# Patient Record
Sex: Male | Born: 2000 | Race: Black or African American | Hispanic: No | Marital: Single | Smoking: Never smoker
Health system: Southern US, Community
[De-identification: ages and names within clinical notes are randomized; demographics above are authoritative.]

## PROBLEM LIST (undated history)

## (undated) ENCOUNTER — Ambulatory Visit (HOSPITAL_COMMUNITY): Admission: EM | Payer: No Typology Code available for payment source

## (undated) DIAGNOSIS — R010 Benign and innocent cardiac murmurs: Secondary | ICD-10-CM

## (undated) DIAGNOSIS — F909 Attention-deficit hyperactivity disorder, unspecified type: Secondary | ICD-10-CM

## (undated) DIAGNOSIS — J45909 Unspecified asthma, uncomplicated: Secondary | ICD-10-CM

## (undated) DIAGNOSIS — F32A Depression, unspecified: Secondary | ICD-10-CM

## (undated) DIAGNOSIS — G43909 Migraine, unspecified, not intractable, without status migrainosus: Secondary | ICD-10-CM

## (undated) DIAGNOSIS — L709 Acne, unspecified: Secondary | ICD-10-CM

## (undated) DIAGNOSIS — T7840XA Allergy, unspecified, initial encounter: Secondary | ICD-10-CM

## (undated) HISTORY — DX: Allergy, unspecified, initial encounter: T78.40XA

## (undated) HISTORY — DX: Attention-deficit hyperactivity disorder, unspecified type: F90.9

## (undated) HISTORY — DX: Benign and innocent cardiac murmurs: R01.0

## (undated) HISTORY — DX: Migraine, unspecified, not intractable, without status migrainosus: G43.909

## (undated) HISTORY — DX: Acne, unspecified: L70.9

---
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
## 2019-01-13 ENCOUNTER — Other Ambulatory Visit: Payer: Self-pay

## 2019-01-13 MED ORDER — AMPHETAMINE-DEXTROAMPHET ER 25 MG PO CP24: 25.0000 mg | ORAL_CAPSULE | ORAL | 0 refills | Status: DC

## 2019-01-13 NOTE — Telephone Encounter (Signed)
Pt mom called and stated patient needs a refill on his Adderall. She stated he has not had it in a few days and he's been having attitude problems.   Pt mom would like a call at [phone] when medication is sent to pharmacy.

## 2019-01-21 IMAGING — DX DG FINGER THUMB 2+V*L*
3 series · 3 of 3 positions shown · non-contrast
Comparison: None

CLINICAL DATA: Jammed thumb today playing basketball, pain
distally, sports injury

EXAM:
LEFT THUMB 2+V

[finger ap]
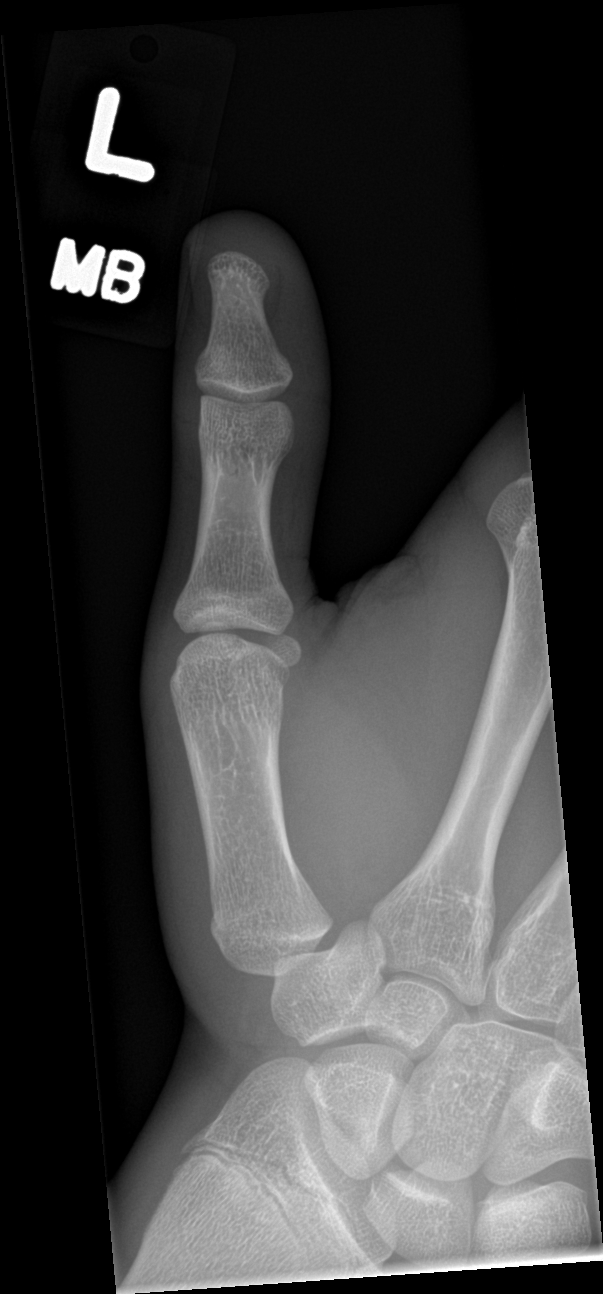

[finger obl]
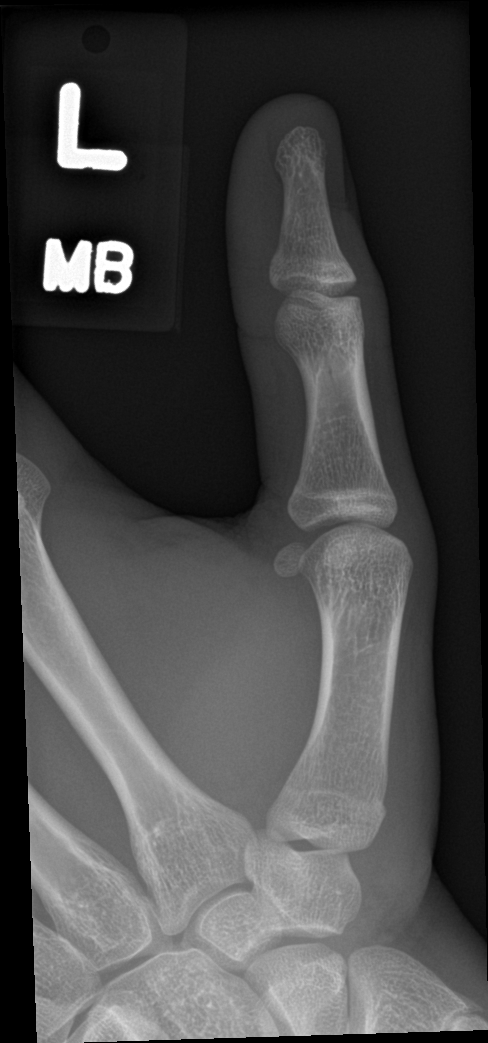

[finger lat]
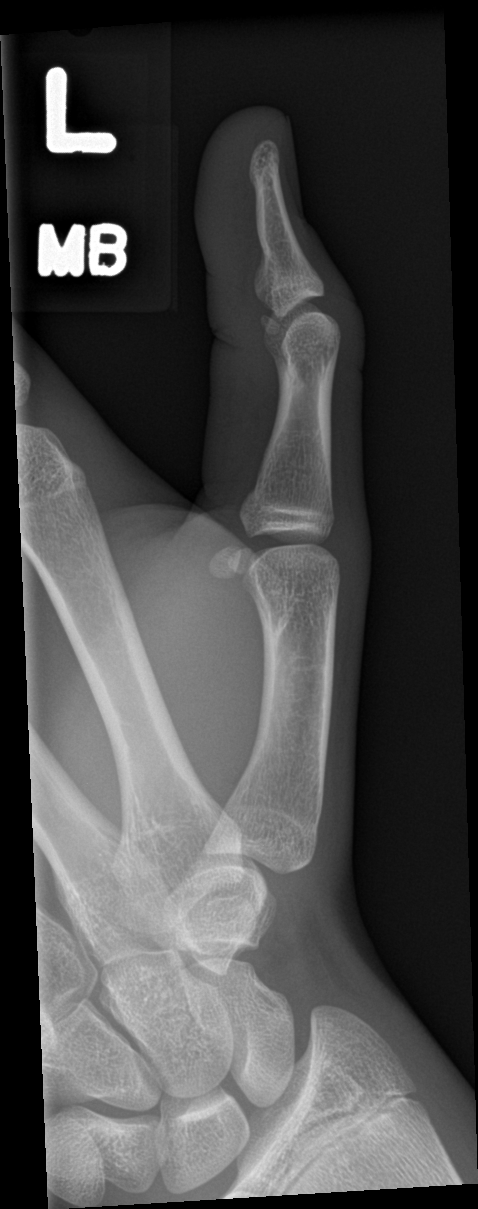

[3 of 3 positions shown; findings below may reference images not displayed]

FINDINGS: Osseous mineralization normal.

Joint spaces preserved.

No acute fracture, dislocation, or bone destruction.
IMPRESSION: No acute osseous abnormalities.

## 2019-04-15 ENCOUNTER — Telehealth: Payer: Self-pay | Admitting: Family Medicine

## 2019-04-15 MED ORDER — AMPHETAMINE-DEXTROAMPHET ER 25 MG PO CP24: 25.0000 mg | ORAL_CAPSULE | ORAL | 0 refills | Status: DC

## 2019-04-15 NOTE — Telephone Encounter (Signed)
Pt mom called and is requesting a refill on pt adderall xr  [id number] [place], [place] , also informed pt mom that pt needs a appt, as it has been a while since pt has been in the office, she states she will call back she has to check pts schedule, as he is in school and works , also informed pt that you was out of the office

## 2019-05-17 ENCOUNTER — Ambulatory Visit (INDEPENDENT_AMBULATORY_CARE_PROVIDER_SITE_OTHER): Payer: Medicaid Other | Admitting: Family Medicine

## 2019-05-17 ENCOUNTER — Encounter: Payer: Self-pay | Admitting: Family Medicine

## 2019-05-17 ENCOUNTER — Other Ambulatory Visit: Payer: Self-pay

## 2019-05-17 VITALS — BP 122/80 | HR 74 | Temp 96.8°F | Wt 165.4 lb

## 2019-05-17 DIAGNOSIS — F902 Attention-deficit hyperactivity disorder, combined type: Secondary | ICD-10-CM

## 2019-05-17 DIAGNOSIS — Z23 Encounter for immunization: Secondary | ICD-10-CM

## 2019-05-17 MED ORDER — AMPHETAMINE-DEXTROAMPHET ER 25 MG PO CP24: 25.0000 mg | ORAL_CAPSULE | ORAL | 0 refills | Status: DC

## 2019-05-17 NOTE — Addendum Note (Signed)
Addended by: Elyse Solan on: 05/17/2019 01:18 PM   Modules accepted: Orders

## 2019-05-17 NOTE — Progress Notes (Signed)
   Subjective:    Patient ID: Ian [name], male    DOB: 06-08-2001, 18 y.o.   MRN: [id number]  HPI He is here for a recheck.  He does have underlying ADHD.  He is not working and plans to go back to [employer].  He does recognize that the Adderall does help him stay focused and therefore stay on task.  He thinks it lasts roughly 8 to 10 hours.  He does not mention any problems when this wears off other than increased distractibility.   Review of Systems     Objective:   Physical Exam Alert and in no distress otherwise not examined      Assessment & Plan:

## 2019-09-28 ENCOUNTER — Telehealth: Payer: Self-pay

## 2019-09-28 MED ORDER — AMPHETAMINE-DEXTROAMPHET ER 25 MG PO CP24: 25.0000 mg | ORAL_CAPSULE | ORAL | 0 refills | Status: DC

## 2019-09-28 NOTE — Telephone Encounter (Signed)
Pt. Mom called stating that he needs a refill on his Adderall 90 day supply [facility] [address] Pt. Last apt. Was 05/17/19.

## 2019-10-24 ENCOUNTER — Ambulatory Visit: Payer: Medicaid Other | Admitting: Family Medicine

## 2019-10-24 ENCOUNTER — Other Ambulatory Visit: Payer: Self-pay

## 2020-07-16 ENCOUNTER — Other Ambulatory Visit: Payer: Self-pay

## 2020-07-16 ENCOUNTER — Emergency Department (HOSPITAL_COMMUNITY): Payer: No Typology Code available for payment source

## 2020-07-16 ENCOUNTER — Emergency Department (HOSPITAL_COMMUNITY)
Admission: EM | Admit: 2020-07-16 | Discharge: 2020-07-16 | Disposition: A | Discharge status: Home / Self Care | Payer: No Typology Code available for payment source | Attending: Emergency Medicine | Admitting: Emergency Medicine

## 2020-07-16 DIAGNOSIS — Z79899 Other long term (current) drug therapy: Secondary | ICD-10-CM | POA: Insufficient documentation

## 2020-07-16 DIAGNOSIS — J4520 Mild intermittent asthma, uncomplicated: Secondary | ICD-10-CM | POA: Insufficient documentation

## 2020-07-16 DIAGNOSIS — S50812A Abrasion of left forearm, initial encounter: Secondary | ICD-10-CM | POA: Insufficient documentation

## 2020-07-16 DIAGNOSIS — Y92410 Unspecified street and highway as the place of occurrence of the external cause: Secondary | ICD-10-CM | POA: Insufficient documentation

## 2020-07-16 DIAGNOSIS — T148XXA Other injury of unspecified body region, initial encounter: Secondary | ICD-10-CM

## 2020-07-16 DIAGNOSIS — S50312A Abrasion of left elbow, initial encounter: Secondary | ICD-10-CM | POA: Insufficient documentation

## 2020-07-16 DIAGNOSIS — S20312A Abrasion of left front wall of thorax, initial encounter: Secondary | ICD-10-CM | POA: Diagnosis not present

## 2020-07-16 DIAGNOSIS — R0782 Intercostal pain: Secondary | ICD-10-CM | POA: Diagnosis present

## 2020-07-16 DIAGNOSIS — R079 Chest pain, unspecified: Secondary | ICD-10-CM | POA: Diagnosis not present
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
## 2020-07-17 ENCOUNTER — Telehealth: Payer: Self-pay

## 2020-07-17 NOTE — Telephone Encounter (Signed)
Transition Care Management Follow-up Telephone Call  Date of discharge and from where: 07/16/2020 [facility]  How have you been since you were released from the hospital? Doing Alright.   Any questions or concerns? No  Items Reviewed:  Did the pt receive and understand the discharge instructions provided? Yes   Medications obtained and verified? No medication given.  Other? No   Any new allergies since your discharge? No   Dietary orders reviewed? Yes  Do you have support at home? Yes   Functional Questionnaire: (I = Independent and D = Dependent) ADLs: I  Bathing/Dressing- I  Meal Prep- I  Eating- I  Maintaining continence- I  Transferring/Ambulation- I  Managing Meds- I  Follow up appointments reviewed:   PCP Hospital f/u appt confirmed? No  Patient does not feel he needs PCP appointment at this time. PCP on file confirmed.   Specialist Hospital f/u appt confirmed? No    Are transportation arrangements needed? No   If their condition worsens, is the pt aware to call PCP or go to the Emergency Dept.? Yes  Was the patient provided with contact information for the PCP's office or ED? Yes  Was to pt encouraged to call back with questions or concerns? Yes

## 2020-07-26 ENCOUNTER — Telehealth: Payer: Medicaid Other | Admitting: Family Medicine

## 2020-07-30 ENCOUNTER — Other Ambulatory Visit: Payer: Self-pay

## 2020-07-30 ENCOUNTER — Ambulatory Visit (INDEPENDENT_AMBULATORY_CARE_PROVIDER_SITE_OTHER): Payer: Medicaid Other | Admitting: Family Medicine

## 2020-07-30 ENCOUNTER — Encounter: Payer: Self-pay | Admitting: Family Medicine

## 2020-07-30 VITALS — BP 128/82 | HR 67 | Temp 96.0°F | Ht 65.0 in | Wt 172.8 lb

## 2020-07-30 DIAGNOSIS — S60022A Contusion of left index finger without damage to nail, initial encounter: Secondary | ICD-10-CM

## 2020-07-30 DIAGNOSIS — Z23 Encounter for immunization: Secondary | ICD-10-CM

## 2020-07-30 NOTE — Progress Notes (Signed)
   Subjective:    Patient ID: Ian [name], male    DOB: 2001-02-25, 19 y.o.   MRN: [id number]  HPI He states that he jammed his left index finger about a week ago playing basketball.  He is having very little difficulty with that. He also would like to get the COVID-vaccine.  Presently he is having no COVID related symptoms.   Review of Systems     Objective:   Physical Exam Exam of the left index finger shows full motion and strength of the finger with minimal tenderness palpation over the MCP.      Assessment & Plan:  Contusion of left index finger without damage to nail, initial encounter  Need for COVID-19 vaccine - Plan: Moderna SARS-CoV-2 Vaccine Recommend buddy taping the index and large finger and no other intervention needed.  He was comfortable with that.

## 2020-08-27 ENCOUNTER — Ambulatory Visit (INDEPENDENT_AMBULATORY_CARE_PROVIDER_SITE_OTHER): Payer: Medicaid Other

## 2020-08-27 DIAGNOSIS — Z23 Encounter for immunization: Secondary | ICD-10-CM | POA: Diagnosis not present

## 2020-12-20 ENCOUNTER — Encounter: Payer: Self-pay | Admitting: Family Medicine

## 2020-12-20 ENCOUNTER — Other Ambulatory Visit: Payer: Self-pay

## 2020-12-20 ENCOUNTER — Ambulatory Visit (INDEPENDENT_AMBULATORY_CARE_PROVIDER_SITE_OTHER): Payer: Medicaid Other | Admitting: Family Medicine

## 2020-12-20 VITALS — BP 128/82 | HR 68 | Temp 97.6°F | Wt 165.4 lb

## 2020-12-20 DIAGNOSIS — F902 Attention-deficit hyperactivity disorder, combined type: Secondary | ICD-10-CM

## 2020-12-20 MED ORDER — AMPHETAMINE-DEXTROAMPHET ER 25 MG PO CP24: 25.0000 mg | ORAL_CAPSULE | ORAL | 0 refills | Status: DC

## 2020-12-20 NOTE — Progress Notes (Signed)
   Subjective:    Patient ID: Ian [name], male    DOB: 2001/01/16, 20 y.o.   MRN: [id number]  HPI  He is here for consult concerning his ADHD.  He has a long history of taking medication for this.  He has tried to avoid it but is now noticing that he is making mistakes at work and he wants to start back on the medication.  Normally it lasts around 8 hours and he has no complaints when he comes off of it.   Review of Systems     Objective:   Physical Exam Alert and in no distress otherwise not examined       Assessment & Plan:  ADHD (attention deficit hyperactivity disorder), combined type - Plan: amphetamine-dextroamphetamine (ADDERALL XR) 25 MG 24 hr capsule, amphetamine-dextroamphetamine (ADDERALL XR) 25 MG 24 hr capsule, amphetamine-dextroamphetamine (ADDERALL XR) 25 MG 24 hr capsule Recommend he take this while at work but explained that he might not necessarily need to take it on a days that he is not working unless he needs to be focused on something.  He was comfortable with that.

## 2021-02-16 ENCOUNTER — Emergency Department (HOSPITAL_COMMUNITY)
Admission: EM | Admit: 2021-02-16 | Discharge: 2021-02-17 | Disposition: A | Discharge status: Home / Self Care | Payer: Worker's Compensation | Attending: Emergency Medicine | Admitting: Emergency Medicine

## 2021-02-16 ENCOUNTER — Other Ambulatory Visit: Payer: Self-pay

## 2021-02-16 ENCOUNTER — Encounter (HOSPITAL_COMMUNITY): Payer: Self-pay | Admitting: Emergency Medicine

## 2021-02-16 DIAGNOSIS — Y93G1 Activity, food preparation and clean up: Secondary | ICD-10-CM | POA: Insufficient documentation

## 2021-02-16 DIAGNOSIS — Y990 Civilian activity done for income or pay: Secondary | ICD-10-CM | POA: Insufficient documentation

## 2021-02-16 DIAGNOSIS — W268XXA Contact with other sharp object(s), not elsewhere classified, initial encounter: Secondary | ICD-10-CM | POA: Insufficient documentation

## 2021-02-16 DIAGNOSIS — Z23 Encounter for immunization: Secondary | ICD-10-CM | POA: Diagnosis not present

## 2021-02-16 DIAGNOSIS — S61211A Laceration without foreign body of left index finger without damage to nail, initial encounter: Secondary | ICD-10-CM | POA: Insufficient documentation

## 2021-02-16 DIAGNOSIS — J45909 Unspecified asthma, uncomplicated: Secondary | ICD-10-CM | POA: Insufficient documentation

## 2021-02-16 DIAGNOSIS — S6992XA Unspecified injury of left wrist, hand and finger(s), initial encounter: Secondary | ICD-10-CM | POA: Diagnosis present

## 2021-02-16 MED ORDER — TETANUS-DIPHTH-ACELL PERTUSSIS 5-2.5-18.5 LF-MCG/0.5 IM SUSY
0.5000 mL | PREFILLED_SYRINGE | Freq: Once | INTRAMUSCULAR | Status: AC
  Administered 2021-02-17: 0.5 mL via INTRAMUSCULAR
  Filled 2021-02-16: qty 0.5

## 2021-02-16 NOTE — ED Provider Notes (Signed)
[facility] EMERGENCY DEPARTMENT Provider Note   CSN: [id number] Arrival date & time: 02/16/21  1723     History Chief Complaint  Patient presents with   Laceration    Ian [name] is a 20 y.o. male presenting for evaluation of finger laceration.  Patient states he was at work when the machine accidentally feet slipped and cut his finger.  He had acute onset pain and bleeding.  He did not take anything for the pain.  He has not done anything to the cut including cleaning it.  He does not know when his last tetanus shot was.  No numbness or tingling.  No injury elsewhere.  Takes Adderall, but no other medicines daily.  HPI     Past Medical History:  Diagnosis Date   Acne    ADHD (attention deficit hyperactivity disorder)    Allergy    Asthma    Functional murmur    Migraines     Patient Active Problem List   Diagnosis Date Noted   Gastroesophageal reflux disease 03/26/2018   Asthma, mild intermittent 03/21/2014   Acne 03/11/2013   Allergic rhinitis due to pollen 03/11/2013   ADHD (attention deficit hyperactivity disorder), combined type 11/03/2011    Past Surgical History:  Procedure Laterality Date   NO PAST SURGERIES  2017       Family History  Problem Relation Age of Onset   Hypertension Mother    Cancer Father        died at 40,unsure type, possibly colon   Hypertension Father    Stroke Maternal Grandmother 63   Heart disease Neg Hx     Social History   Tobacco Use   Smoking status: Never   Smokeless tobacco: Never  Substance Use Topics   Alcohol use: No   Drug use: No    Home Medications Prior to Admission medications   Medication Sig Start Date End Date Taking? Authorizing Provider  albuterol (VENTOLIN HFA) 108 (90 BASE) MCG/ACT inhaler Inhale 2 puffs into the lungs every 6 (six) hours as needed. Patient taking differently: Inhale 2 puffs into the lungs every 6 (six) hours as needed for wheezing or shortness of  breath. 03/21/14   Ronnald Nian, MD  amphetamine-dextroamphetamine (ADDERALL XR) 25 MG 24 hr capsule Take 1 capsule by mouth every morning. 02/19/21   Ronnald Nian, MD  amphetamine-dextroamphetamine (ADDERALL XR) 25 MG 24 hr capsule Take 1 capsule by mouth every morning. 01/19/21   Ronnald Nian, MD  amphetamine-dextroamphetamine (ADDERALL XR) 25 MG 24 hr capsule Take 1 capsule by mouth every morning. 12/20/20   Ronnald Nian, MD  Cyanocobalamin (VITAMIN B12 PO) Take 1 tablet by mouth daily.    [provider]  ibuprofen (ADVIL,MOTRIN) 800 MG tablet Take 1 tablet (800 mg total) by mouth 3 (three) times daily. Patient not taking: No sig reported 01/17/17   Dorena Bodo, NP  Multiple Vitamins-Minerals (ONE-A-DAY MENS HEALTH FORMULA) TABS Take 1 tablet by mouth daily.    [provider]  omeprazole (PRILOSEC) 40 MG capsule TAKE 1 CAPSULE BY MOUTH EVERY DAY Patient not taking: No sig reported 11/01/18   Ronnald Nian, MD    Allergies    Patient has no known allergies.  Review of Systems   Review of Systems  Skin:  Positive for wound.  Hematological:  Does not bruise/bleed easily.   Physical Exam Updated Vital Signs BP 136/79 (BP Location: Right Arm)   Pulse 67  Temp 98.2 F (36.8 C) (Oral)   Resp 16   SpO2 97%   Physical Exam Vitals and nursing note reviewed.  Constitutional:      General: He is not in acute distress.    Appearance: He is well-developed.  HENT:     Head: Normocephalic and atraumatic.  Eyes:     Extraocular Movements: Extraocular movements intact.  Cardiovascular:     Rate and Rhythm: Normal rate.  Pulmonary:     Effort: Pulmonary effort is normal.  Abdominal:     General: There is no distension.  Musculoskeletal:        General: Normal range of motion.     Cervical back: Normal range of motion.     Comments: 1 cm laceration adjacent to the nail of the left index finger on the radial aspect. No active bleeding. Good rom and strength  against resistance of all joints when held in isolation.   Skin:    General: Skin is warm.     Findings: No rash.  Neurological:     Mental Status: He is alert and oriented to person, place, and time.    ED Results / Procedures / Treatments   Labs (all labs ordered are listed, but only abnormal results are displayed) Labs Reviewed - No data to display  EKG None  Radiology No results found.  Procedures Procedures   Medications Ordered in ED Medications  Tdap (BOOSTRIX) injection 0.5 mL (has no administration in time range)    ED Course  I have reviewed the triage vital signs and the nursing notes.  Pertinent labs & imaging results that were available during my care of the patient were reviewed by me and considered in my medical decision making (see chart for details).    MDM Rules/Calculators/A&P                           Patient presenting for evaluation of finger laceration.  On exam, patient appears nontoxic.  He is neurovascular intact.  Laceration is small, not actively bleeding.  No x-ray required.  Patient does not require stitches.  Will clean wound and apply a sterile dressing.  Tetanus updated.  At this time, patient appears safe for discharge.  Return precautions given.  Patient states he understands and agrees to plan.  Final Clinical Impression(s) / ED Diagnoses Final diagnoses:  Laceration of left index finger without foreign body without damage to nail, initial encounter    Rx / DC Orders ED Discharge Orders     None        Kimberlin Scheel, PA-C 02/17/21 0000    Gilda Crease, MD 02/17/21 [phone]

## 2021-02-16 NOTE — ED Triage Notes (Signed)
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  Bandage in place on arrival. Unknown DT.

## 2021-02-16 NOTE — Discharge Instructions (Addendum)
Wash daily with soap and water. Keep covered and clean Use Tylenol or ibuprofen as needed for pain. Return to the emergency room with any new, sudden, concerning symptoms

## 2021-02-17 NOTE — ED Notes (Signed)
E-signature pad unavailable at time of pt discharge. This RN discussed discharge materials with pt and answered all pt questions. Pt stated understanding of discharge material. ? ?

## 2021-02-18 ENCOUNTER — Telehealth: Payer: Self-pay

## 2021-02-18 NOTE — Telephone Encounter (Signed)
Transition Care Management Follow-up Telephone Call Date of discharge and from where: 02/17/2021-[facility] ED  How have you been since you were released from the hospital? Patient stated he is doing fine.  Any questions or concerns? No  Items Reviewed: Did the pt receive and understand the discharge instructions provided? Yes  Medications obtained and verified? Yes  Other? No  Any new allergies since your discharge? No  Dietary orders reviewed? N/A Do you have support at home? Yes   Home Care and Equipment/Supplies: Were home health services ordered? not applicable If so, what is the name of the agency? N/A  Has the agency set up a time to come to the patient's home? not applicable Were any new equipment or medical supplies ordered?  No What is the name of the medical supply agency? N/A Were you able to get the supplies/equipment? not applicable Do you have any questions related to the use of the equipment or supplies? No  Functional Questionnaire: (I = Independent and D = Dependent) ADLs: I  Bathing/Dressing- I  Meal Prep- I  Eating- I  Maintaining continence- I  Transferring/Ambulation- I  Managing Meds- I  Follow up appointments reviewed:  PCP Hospital f/u appt confirmed? No  . Specialist Hospital f/u appt confirmed? No   Are transportation arrangements needed? No  If their condition worsens, is the pt aware to call PCP or go to the Emergency Dept.? Yes Was the patient provided with contact information for the PCP's office or ED? Yes Was to pt encouraged to call back with questions or concerns? Yes

## 2021-04-05 ENCOUNTER — Telehealth: Payer: Self-pay | Admitting: Family Medicine

## 2021-04-05 DIAGNOSIS — F902 Attention-deficit hyperactivity disorder, combined type: Secondary | ICD-10-CM

## 2021-04-05 MED ORDER — AMPHETAMINE-DEXTROAMPHET ER 25 MG PO CP24: 25.0000 mg | ORAL_CAPSULE | ORAL | 0 refills | Status: DC

## 2021-04-05 NOTE — Telephone Encounter (Signed)
Needs refill on adderall   [facility] [address]

## 2021-04-26 DIAGNOSIS — H5213 Myopia, bilateral: Secondary | ICD-10-CM | POA: Diagnosis not present

## 2021-06-18 ENCOUNTER — Telehealth: Payer: Self-pay | Admitting: Family Medicine

## 2021-06-18 NOTE — Telephone Encounter (Signed)
Called pt no answer and VM not set up. Will call 06/19/21

## 2021-06-18 NOTE — Telephone Encounter (Signed)
Pt needs [facility]ill [facility] [address] 

## 2021-06-20 NOTE — Telephone Encounter (Signed)
Mom advised Rx at pharmacy, she said she went up there and they said they didn't have Rx.  I called pharmacy and they had refill and will fill.  Mom informed

## 2022-04-30 ENCOUNTER — Telehealth: Payer: Self-pay

## 2022-04-30 ENCOUNTER — Encounter: Payer: Self-pay | Admitting: Family Medicine

## 2022-04-30 NOTE — Telephone Encounter (Signed)
Pt. Called stating he was getting a physical done for his new job and needed a letter typed up by his PCP stating he is ok to drive while on a controlled substance. He takes Adderall XR 25mg .

## 2022-05-05 ENCOUNTER — Telehealth: Payer: Self-pay | Admitting: Family Medicine

## 2022-05-05 ENCOUNTER — Telehealth: Payer: Self-pay

## 2022-05-05 ENCOUNTER — Encounter: Payer: Self-pay | Admitting: Family Medicine

## 2022-05-05 NOTE — Telephone Encounter (Signed)
Pt called he needs note for work showing when and how he is supposed to be taking  Adderall   Please call when rady

## 2022-05-05 NOTE — Telephone Encounter (Signed)
Letter typed, & pt informed ready for pick up

## 2022-05-05 NOTE — Telephone Encounter (Signed)
Pt. Called back stating that at the [facility] on [address] on [address] needed a PNP awareness print out. 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

## 2022-05-06 ENCOUNTER — Other Ambulatory Visit: Payer: Self-pay | Admitting: Medical

## 2022-05-06 NOTE — Telephone Encounter (Signed)
Dr. Redmond School its the PDMP report he needs, if you will please print it out. Thanks
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
## 2022-07-22 ENCOUNTER — Telehealth (INDEPENDENT_AMBULATORY_CARE_PROVIDER_SITE_OTHER): Payer: Medicaid Other | Admitting: Family Medicine

## 2022-07-22 DIAGNOSIS — F902 Attention-deficit hyperactivity disorder, combined type: Secondary | ICD-10-CM | POA: Diagnosis not present

## 2022-07-22 NOTE — Progress Notes (Signed)
   Subjective:    Patient ID: Ian [name], male    DOB: 06-07-2001, 22 y.o.   MRN: [id number]  HPI Documentation for virtual audio and video telecommunications through [place] encounter: The patient was located at home. 2 patient identifiers used.  The provider was located in the office. The patient did consent to this visit and is aware of possible charges through their insurance for this visit. The other persons participating in this telemedicine service were none. Time spent on call was 5 minutes and in review of previous records >20 minutes total for counseling and coordination of care. This virtual service is not related to other E/M service within previous 7 days.  He has a history of ADHD and presently is taking Adderall XR 25.  His main concern today is how often does he need to take the medication.  I further questioned him on how long it lasted.  He could not give me a good answer as he does take it around 3 in the afternoon to help him with his work but apparently does not interfere with his sleep.  Apparently does help with focus although is difficult to get him to fully assess this.  Review of Systems     Objective:   Physical Exam Alert and in no distress otherwise not examined       Assessment & Plan:  ADHD (attention deficit hyperactivity disorder), combined type Discussed the fact that he can take this medication on an as-needed basis specifically stating if he is not working and does not need to stay focused is okay to not take it.  If he notes that his focus is not what it should be, he should let me know so we can potentially readjust the medication.

## 2022-08-13 ENCOUNTER — Telehealth: Payer: Self-pay | Admitting: Family Medicine

## 2022-08-13 DIAGNOSIS — F902 Attention-deficit hyperactivity disorder, combined type: Secondary | ICD-10-CM

## 2022-08-13 MED ORDER — AMPHETAMINE-DEXTROAMPHET ER 25 MG PO CP24: 25.0000 mg | ORAL_CAPSULE | ORAL | 0 refills | Status: DC

## 2022-08-13 NOTE — Telephone Encounter (Signed)
[facility]

## 2022-09-03 ENCOUNTER — Emergency Department (HOSPITAL_BASED_OUTPATIENT_CLINIC_OR_DEPARTMENT_OTHER)
Admission: EM | Admit: 2022-09-03 | Discharge: 2022-09-03 | Disposition: A | Discharge status: Home / Self Care | Payer: Medicaid Other | Attending: Emergency Medicine | Admitting: Emergency Medicine

## 2022-09-03 ENCOUNTER — Emergency Department (HOSPITAL_BASED_OUTPATIENT_CLINIC_OR_DEPARTMENT_OTHER): Payer: Medicaid Other

## 2022-09-03 ENCOUNTER — Encounter (HOSPITAL_BASED_OUTPATIENT_CLINIC_OR_DEPARTMENT_OTHER): Payer: Self-pay

## 2022-09-03 ENCOUNTER — Other Ambulatory Visit: Payer: Self-pay

## 2022-09-03 DIAGNOSIS — S0992XA Unspecified injury of nose, initial encounter: Secondary | ICD-10-CM

## 2022-09-03 DIAGNOSIS — S0033XA Contusion of nose, initial encounter: Secondary | ICD-10-CM | POA: Insufficient documentation

## 2022-09-03 DIAGNOSIS — Z041 Encounter for examination and observation following transport accident: Secondary | ICD-10-CM | POA: Diagnosis not present

## 2022-09-03 DIAGNOSIS — J3489 Other specified disorders of nose and nasal sinuses: Secondary | ICD-10-CM | POA: Diagnosis present

## 2022-09-03 DIAGNOSIS — S0993XA Unspecified injury of face, initial encounter: Secondary | ICD-10-CM | POA: Diagnosis not present

## 2022-09-03 DIAGNOSIS — I10 Essential (primary) hypertension: Secondary | ICD-10-CM | POA: Diagnosis not present

## 2022-09-03 DIAGNOSIS — Y92410 Unspecified street and highway as the place of occurrence of the external cause: Secondary | ICD-10-CM | POA: Diagnosis not present

## 2022-09-03 MED ORDER — CYCLOBENZAPRINE HCL 10 MG PO TABS: 10.0000 mg | ORAL_TABLET | Freq: Two times a day (BID) | ORAL | 0 refills | Status: DC | PRN

## 2022-09-03 MED ORDER — IBUPROFEN 800 MG PO TABS: 800.0000 mg | ORAL_TABLET | Freq: Once | ORAL | Status: DC
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
## 2022-09-08 ENCOUNTER — Ambulatory Visit: Payer: Medicaid Other | Admitting: Medical

## 2022-09-11 ENCOUNTER — Ambulatory Visit: Payer: Medicaid Other | Admitting: Medical

## 2022-09-15 ENCOUNTER — Ambulatory Visit: Payer: Medicaid Other | Admitting: Medical

## 2022-09-15 ENCOUNTER — Other Ambulatory Visit: Payer: Self-pay | Admitting: *Deleted

## 2022-09-15 ENCOUNTER — Encounter: Payer: Self-pay | Admitting: Medical

## 2022-09-15 ENCOUNTER — Encounter: Payer: Self-pay | Admitting: Family Medicine

## 2022-09-15 DIAGNOSIS — M79644 Pain in right finger(s): Secondary | ICD-10-CM | POA: Diagnosis not present

## 2022-09-15 DIAGNOSIS — M545 Low back pain, unspecified: Secondary | ICD-10-CM

## 2022-09-15 DIAGNOSIS — S060X9D Concussion with loss of consciousness of unspecified duration, subsequent encounter: Secondary | ICD-10-CM | POA: Diagnosis not present

## 2022-09-15 DIAGNOSIS — R519 Headache, unspecified: Secondary | ICD-10-CM | POA: Diagnosis not present
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
## 2022-09-15 NOTE — Progress Notes (Signed)
done 
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
## 2022-09-22 ENCOUNTER — Telehealth: Payer: Self-pay | Admitting: Family Medicine

## 2022-09-22 DIAGNOSIS — F902 Attention-deficit hyperactivity disorder, combined type: Secondary | ICD-10-CM

## 2022-09-22 MED ORDER — AMPHETAMINE-DEXTROAMPHET ER 25 MG PO CP24: 25.0000 mg | ORAL_CAPSULE | ORAL | 0 refills | Status: DC

## 2022-09-22 NOTE — Telephone Encounter (Signed)
Pt called and is requesting a refill for his adderall [id number] [place]

## 2022-10-06 ENCOUNTER — Telehealth: Payer: Self-pay | Admitting: Family Medicine

## 2022-10-06 NOTE — Telephone Encounter (Signed)
Pt called and is wanting to know if you can refer him out to a psychiatrist. Informed him that you do not work in the office on Mondays

## 2022-10-07 NOTE — Telephone Encounter (Signed)
Patient advised. Appointment scheduled.  

## 2022-10-09 ENCOUNTER — Encounter: Payer: Self-pay | Admitting: Family Medicine

## 2022-10-09 ENCOUNTER — Telehealth (INDEPENDENT_AMBULATORY_CARE_PROVIDER_SITE_OTHER): Payer: Medicaid Other | Admitting: Family Medicine

## 2022-10-09 VITALS — Wt 170.0 lb

## 2022-10-09 DIAGNOSIS — F439 Reaction to severe stress, unspecified: Secondary | ICD-10-CM

## 2022-10-09 NOTE — Progress Notes (Signed)
   Subjective:    Patient ID: Ian [name], male    DOB: Jul 07, 2001, 22 y.o.   MRN: [id number]  HPI He is here for consult concerning needing counseling.  He is now realizing that he has had a lot of things that he has bottled up inside of him wants to talk about it with someone to help resolve all these issues.  He does not state anything in particular but his general complaints.  He does not have any suicidal ideation.   Review of Systems     Objective:   Physical Exam Alert and in no distress otherwise not examined       Assessment & Plan:  Stress I congratulated him on when the take better care of himself psychologically.  I will refer him to [facility].

## 2022-10-27 ENCOUNTER — Telehealth: Payer: Self-pay | Admitting: Family Medicine

## 2022-10-27 DIAGNOSIS — F902 Attention-deficit hyperactivity disorder, combined type: Secondary | ICD-10-CM

## 2022-10-27 MED ORDER — AMPHETAMINE-DEXTROAMPHET ER 25 MG PO CP24: 25.0000 mg | ORAL_CAPSULE | ORAL | 0 refills | Status: DC

## 2022-10-27 NOTE — Telephone Encounter (Signed)
Pt called and is requesting a refill on his adderall [id number] , [place] - [address] 

## 2022-11-12 ENCOUNTER — Telehealth: Payer: Self-pay | Admitting: Family Medicine

## 2022-11-12 NOTE — Telephone Encounter (Signed)
Pt needs letter for school stating that he is taking adderall XR  and how he is taking  Please call

## 2022-11-24 DIAGNOSIS — F819 Developmental disorder of scholastic skills, unspecified: Secondary | ICD-10-CM | POA: Diagnosis not present

## 2022-11-24 DIAGNOSIS — F411 Generalized anxiety disorder: Secondary | ICD-10-CM | POA: Diagnosis not present

## 2022-11-24 DIAGNOSIS — F4381 Prolonged grief disorder: Secondary | ICD-10-CM | POA: Diagnosis not present

## 2022-12-02 ENCOUNTER — Telehealth: Payer: Self-pay | Admitting: Family Medicine

## 2022-12-02 DIAGNOSIS — F902 Attention-deficit hyperactivity disorder, combined type: Secondary | ICD-10-CM

## 2022-12-02 MED ORDER — AMPHETAMINE-DEXTROAMPHET ER 25 MG PO CP24: 25.0000 mg | ORAL_CAPSULE | ORAL | 0 refills | Status: DC

## 2022-12-02 NOTE — Telephone Encounter (Signed)
Pt mom called and is requesting pt adderall [facility] to [id number] - [place], [place] - [address]

## 2022-12-25 ENCOUNTER — Telehealth: Payer: Self-pay | Admitting: Family Medicine

## 2022-12-25 DIAGNOSIS — F902 Attention-deficit hyperactivity disorder, combined type: Secondary | ICD-10-CM

## 2022-12-25 MED ORDER — AMPHETAMINE-DEXTROAMPHET ER 25 MG PO CP24: 25.0000 mg | ORAL_CAPSULE | ORAL | 0 refills | Status: DC

## 2022-12-25 NOTE — Telephone Encounter (Signed)
Pt needs a [id number] [place], [place] - [address]

## 2023-01-29 ENCOUNTER — Telehealth: Payer: Self-pay | Admitting: Family Medicine

## 2023-01-29 DIAGNOSIS — F902 Attention-deficit hyperactivity disorder, combined type: Secondary | ICD-10-CM

## 2023-01-29 MED ORDER — AMPHETAMINE-DEXTROAMPHET ER 25 MG PO CP24: 25.0000 mg | ORAL_CAPSULE | ORAL | 0 refills | Status: DC

## 2023-01-29 NOTE — Telephone Encounter (Signed)
Pt called requesting refill on Adderall XR 25   [facility] [address]

## 2023-02-08 ENCOUNTER — Encounter (HOSPITAL_COMMUNITY): Payer: Self-pay

## 2023-02-08 ENCOUNTER — Ambulatory Visit (HOSPITAL_COMMUNITY)
Admission: EM | Admit: 2023-02-08 | Discharge: 2023-02-08 | Disposition: A | Discharge status: Home / Self Care | Payer: Medicaid Other

## 2023-02-08 DIAGNOSIS — S61412A Laceration without foreign body of left hand, initial encounter: Secondary | ICD-10-CM | POA: Diagnosis not present

## 2023-02-08 NOTE — Discharge Instructions (Addendum)
Your laceration is very superficial.  It does not require any sutures or glue to repair it.  Cleanse the wound at least twice daily.  You may apply Aquaphor or another type of ointment such as Vaseline to the wound twice daily to promote wound healing.  Watch out for signs of infection such as redness, swelling, pus coming out of the site, or worsening pain.  If you develop any new or worsening symptoms or if your symptoms do not start to improve, pleases return here or follow-up with your primary care provider. If your symptoms are severe, please go to the emergency room.
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
## 2023-02-09 ENCOUNTER — Ambulatory Visit (INDEPENDENT_AMBULATORY_CARE_PROVIDER_SITE_OTHER): Payer: Medicaid Other | Admitting: Family Medicine

## 2023-02-09 ENCOUNTER — Encounter: Payer: Self-pay | Admitting: Family Medicine

## 2023-02-09 VITALS — BP 130/68 | HR 67 | Temp 97.9°F | Resp 16 | Ht 66.5 in | Wt 167.0 lb

## 2023-02-09 DIAGNOSIS — F902 Attention-deficit hyperactivity disorder, combined type: Secondary | ICD-10-CM | POA: Diagnosis not present

## 2023-02-09 DIAGNOSIS — Z0000 Encounter for general adult medical examination without abnormal findings: Secondary | ICD-10-CM | POA: Diagnosis not present

## 2023-02-09 DIAGNOSIS — J301 Allergic rhinitis due to pollen: Secondary | ICD-10-CM

## 2023-02-09 DIAGNOSIS — L709 Acne, unspecified: Secondary | ICD-10-CM

## 2023-02-09 DIAGNOSIS — J4520 Mild intermittent asthma, uncomplicated: Secondary | ICD-10-CM

## 2023-02-09 DIAGNOSIS — K219 Gastro-esophageal reflux disease without esophagitis: Secondary | ICD-10-CM

## 2023-02-09 DIAGNOSIS — F320 Major depressive disorder, single episode, mild: Secondary | ICD-10-CM | POA: Diagnosis not present
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
## 2023-02-09 NOTE — Patient Instructions (Signed)
Call me after the visit to the therapist and let me know how it went.  Have the therapist send me a note

## 2023-02-10 LAB — COMPREHENSIVE METABOLIC PANEL
ALT: 15 IU/L (ref 0–44)
AST: 19 IU/L (ref 0–40)
Albumin: 4.7 g/dL (ref 4.3–5.2)
Alkaline Phosphatase: 74 IU/L (ref 44–121)
BUN/Creatinine Ratio: 12 (ref 9–20)
BUN: 11 mg/dL (ref 6–20)
Bilirubin Total: 0.3 mg/dL (ref 0.0–1.2)
CO2: 25 mmol/L (ref 20–29)
Calcium: 9.7 mg/dL (ref 8.7–10.2)
Chloride: 104 mmol/L (ref 96–106)
Creatinine, Ser: 0.89 mg/dL (ref 0.76–1.27)
Globulin, Total: 2.3 g/dL (ref 1.5–4.5)
Glucose: 84 mg/dL (ref 70–99)
Potassium: 5 mmol/L (ref 3.5–5.2)
Sodium: 141 mmol/L (ref 134–144)
Total Protein: 7 g/dL (ref 6.0–8.5)
eGFR: 124 mL/min/{1.73_m2} (ref >59)

## 2023-02-10 LAB — CBC WITH DIFFERENTIAL/PLATELET
Basophils Absolute: 0.1 10*3/uL (ref 0.0–0.2)
Basos: 2 %
EOS (ABSOLUTE): 0.8 10*3/uL — ABNORMAL HIGH (ref 0.0–0.4)
Eos: 18 %
Hematocrit: 48.1 % (ref 37.5–51.0)
Hemoglobin: 15.3 g/dL (ref 13.0–17.7)
Immature Grans (Abs): 0 10*3/uL (ref 0.0–0.1)
Immature Granulocytes: 0 %
Lymphocytes Absolute: 1.6 10*3/uL (ref 0.7–3.1)
Lymphs: 38 %
MCH: 27.8 pg (ref 26.6–33.0)
MCHC: 31.8 g/dL (ref 31.5–35.7)
MCV: 88 fL (ref 79–97)
Monocytes Absolute: 0.3 10*3/uL (ref 0.1–0.9)
Monocytes: 7 %
Neutrophils Absolute: 1.5 10*3/uL (ref 1.4–7.0)
Neutrophils: 35 %
Platelets: 248 10*3/uL (ref 150–450)
RBC: 5.5 x10E6/uL (ref 4.14–5.80)
RDW: 12.6 % (ref 11.6–15.4)
WBC: 4.3 10*3/uL (ref 3.4–10.8)

## 2023-02-10 LAB — LIPID PANEL
Chol/HDL Ratio: 2.5 ratio (ref 0.0–5.0)
Cholesterol, Total: 133 mg/dL (ref 100–199)
HDL: 53 mg/dL (ref >39)
LDL Chol Calc (NIH): 71 mg/dL (ref 0–99)
Triglycerides: 37 mg/dL (ref 0–149)
VLDL Cholesterol Cal: 9 mg/dL (ref 5–40)

## 2023-03-02 DIAGNOSIS — F819 Developmental disorder of scholastic skills, unspecified: Secondary | ICD-10-CM | POA: Diagnosis not present

## 2023-03-02 DIAGNOSIS — F411 Generalized anxiety disorder: Secondary | ICD-10-CM | POA: Diagnosis not present

## 2023-03-02 DIAGNOSIS — F4381 Prolonged grief disorder: Secondary | ICD-10-CM | POA: Diagnosis not present

## 2023-03-03 ENCOUNTER — Telehealth: Payer: Self-pay | Admitting: Family Medicine

## 2023-03-03 NOTE — Telephone Encounter (Signed)
Needs refill on adderall xr   Patient would also like you to call him, he wants to talk to you about his counseling appointment he had yesterday

## 2023-03-14 ENCOUNTER — Encounter (HOSPITAL_COMMUNITY): Payer: Self-pay

## 2023-03-14 ENCOUNTER — Ambulatory Visit (HOSPITAL_COMMUNITY)
Admission: EM | Admit: 2023-03-14 | Discharge: 2023-03-14 | Disposition: A | Discharge status: Home / Self Care | Payer: Medicaid Other | Source: Home / Self Care | Attending: Family Medicine | Admitting: Family Medicine

## 2023-03-14 ENCOUNTER — Ambulatory Visit (INDEPENDENT_AMBULATORY_CARE_PROVIDER_SITE_OTHER): Payer: Medicaid Other

## 2023-03-14 DIAGNOSIS — M79645 Pain in left finger(s): Secondary | ICD-10-CM | POA: Diagnosis not present

## 2023-03-14 DIAGNOSIS — S6010XA Contusion of unspecified finger with damage to nail, initial encounter: Secondary | ICD-10-CM

## 2023-03-14 MED ORDER — IBUPROFEN 800 MG PO TABS: 800.0000 mg | ORAL_TABLET | Freq: Three times a day (TID) | ORAL | 0 refills | Status: DC
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
## 2023-03-16 ENCOUNTER — Telehealth: Payer: Self-pay | Admitting: Family Medicine

## 2023-03-16 DIAGNOSIS — F902 Attention-deficit hyperactivity disorder, combined type: Secondary | ICD-10-CM

## 2023-03-16 MED ORDER — AMPHETAMINE-DEXTROAMPHET ER 25 MG PO CP24: 25.0000 mg | ORAL_CAPSULE | ORAL | 0 refills | Status: DC

## 2023-03-16 NOTE — Telephone Encounter (Signed)
Pt requesting a refill on adderall to  [id number] - [place], [place] - [address]

## 2023-03-17 ENCOUNTER — Telehealth: Payer: Self-pay | Admitting: Family Medicine

## 2023-03-18 ENCOUNTER — Ambulatory Visit: Payer: Medicaid Other | Admitting: Family Medicine

## 2023-03-18 ENCOUNTER — Encounter: Payer: Self-pay | Admitting: Family Medicine

## 2023-03-18 DIAGNOSIS — M545 Low back pain, unspecified: Secondary | ICD-10-CM | POA: Diagnosis not present

## 2023-03-18 NOTE — ED Provider Notes (Signed)
[facility]   [id number] 03/14/23 Arrival Time: 1255  ASSESSMENT & PLAN:  1. Finger pain, left   2. Subungual hematoma of digit of hand, initial encounter    I have personally viewed and independently interpreted the imaging studies ordered this visit. LEFT fifth finger: no fx appreciated.  DG Finger Little Left  Result Date: 03/14/2023 CLINICAL DATA:  Small finger injured in a car door today, pain. EXAM: LEFT FINGER(S) - 2+ VIEW COMPARISON:  None Available. FINDINGS: There is potential soft tissue swelling medial to the small finger metacarpal. Dorsal soft tissue swelling over the MCP joints on the lateral projection. No fracture or acute bony finding identified. IMPRESSION: 1. Soft tissue swelling, but without underlying fracture or acute bony findings. Electronically Signed   By: Gaylyn Rong M.D.   On: 03/14/2023 14:49    Procedure: Drainage of Subungual Hematoma LEFT fifth finger fingernail cleaned with chlorhexidine. Declines digital block. R/B/I/A discussed. Complications may include loss of the nail, re-accumulation of hematoma, and infection. Verbal consent to proceed obtained. Single nail trephination successfully performed via cautery pen with drainage of dark red blood. Patient reports relief of pressure. Site bandaged. To keep digit clean and dry for 48 hours.  Recommend:  Follow-up Information     [facility].   Specialty: Urgent Care Why: As needed. Contact information: [address] [place] [address] [phone]               Meds ordered this encounter  Medications   ibuprofen (ADVIL) 800 MG tablet    Sig: Take 1 tablet (800 mg total) by mouth 3 (three) times daily with meals.    Dispense:  21 tablet    Refill:  0     Reviewed expectations re: course of current medical issues. Questions answered. Outlined signs and symptoms indicating need for more acute intervention. Patient  verbalized understanding. After Visit Summary given.  SUBJECTIVE: History from: patient. CARDEN BYCE is a 22 y.o. male who reports L-pinky finger injury that happened today. Pt stated his finger was caught between a metal pole.  No extremity sensation changes or weakness.   Past Surgical History:  Procedure Laterality Date   NO PAST SURGERIES  2017      OBJECTIVE:  Vitals:   03/14/23 1327  BP: 131/83  Pulse: 83  Resp: 16  Temp: 98.3 F (36.8 C)  TempSrc: Oral  SpO2: 98%    General appearance: alert; no distress HEENT: [place]; AT Neck: supple with FROM Resp: unlabored respirations Extremities: L fifth finger: bleeding under nail; very TTP CV: brisk extremity capillary refill of LUE; 2+ radial pulse of LUE. Skin: warm and dry; no visible rashes Neurologic: gait normal; normal sensation and strength of LUE Psychological: alert and cooperative; normal mood and affect  Imaging: DG Finger Little Left  Result Date: 03/14/2023 CLINICAL DATA:  Small finger injured in a car door today, pain. EXAM: LEFT FINGER(S) - 2+ VIEW COMPARISON:  None Available. FINDINGS: There is potential soft tissue swelling medial to the small finger metacarpal. Dorsal soft tissue swelling over the MCP joints on the lateral projection. No fracture or acute bony finding identified. IMPRESSION: 1. Soft tissue swelling, but without underlying fracture or acute bony findings. Electronically Signed   By: Gaylyn Rong M.D.   On: 03/14/2023 14:49       No Known Allergies  Past Medical History:  Diagnosis Date   Acne    ADHD (attention deficit hyperactivity  disorder)    Allergy    Asthma    Functional murmur    Migraines    Social History   Socioeconomic History   Marital status: Single    Spouse name: Not on file   Number of children: Not on file   Years of education: Not on file   Highest education level: Not on file  Occupational History   Not on file  Tobacco Use   Smoking  status: Never   Smokeless tobacco: Never  Substance and Sexual Activity   Alcohol use: No   Drug use: No   Sexual activity: Not on file  Other Topics Concern   Not on file  Social History Narrative   Lives with mom and older brother. No pets or tobacco exposure (outside dog).  10th grade, [name]   Social Determinants of Health   Financial Resource Strain: Not on file  Food Insecurity: No Food Insecurity (02/09/2023)   Hunger Vital Sign    Worried About Running Out of Food in the Last Year: Never true    Ran Out of Food in the Last Year: Never true  Transportation Needs: No Transportation Needs (02/09/2023)   PRAPARE - Administrator, Civil Service (Medical): No    Lack of Transportation (Non-Medical): No  Physical Activity: Insufficiently Active (02/09/2023)   Exercise Vital Sign    Days of Exercise per Week: 3 days    Minutes of Exercise per Session: 30 min  Stress: Not on file  Social Connections: Unknown (12/03/2021)   Received from [facility]   Social Network    Social Network: Not on file   Family History  Problem Relation Age of Onset   Hypertension Mother    Cancer Father        died at 40,unsure type, possibly colon   Hypertension Father    Stroke Maternal Grandmother 52   Heart disease Neg Hx    Past Surgical History:  Procedure Laterality Date   NO PAST SURGERIES  2017       Mardella Layman, MD 03/18/23 [phone]
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
## 2023-03-25 ENCOUNTER — Telehealth: Payer: Self-pay

## 2023-03-25 DIAGNOSIS — F902 Attention-deficit hyperactivity disorder, combined type: Secondary | ICD-10-CM

## 2023-03-25 NOTE — Telephone Encounter (Signed)
Mother called asking for a written a rx for adderall 25 MG. [facility] not [facility]. She would like to take the written rx to pharmacies near her to see if they can refill it. Please advise.

## 2023-03-26 MED ORDER — AMPHETAMINE-DEXTROAMPHET ER 25 MG PO CP24: 25.0000 mg | ORAL_CAPSULE | ORAL | 0 refills | Status: DC

## 2023-03-26 NOTE — Telephone Encounter (Signed)
Pt mom called back and states that [facility] [address] has the adderall in stock if you could send it in stock for pt

## 2023-04-20 DIAGNOSIS — F4381 Prolonged grief disorder: Secondary | ICD-10-CM | POA: Diagnosis not present

## 2023-04-20 DIAGNOSIS — F819 Developmental disorder of scholastic skills, unspecified: Secondary | ICD-10-CM | POA: Diagnosis not present

## 2023-04-20 DIAGNOSIS — F411 Generalized anxiety disorder: Secondary | ICD-10-CM | POA: Diagnosis not present

## 2023-04-23 ENCOUNTER — Telehealth: Payer: Self-pay | Admitting: Family Medicine

## 2023-04-23 DIAGNOSIS — F902 Attention-deficit hyperactivity disorder, combined type: Secondary | ICD-10-CM

## 2023-04-23 MED ORDER — AMPHETAMINE-DEXTROAMPHET ER 25 MG PO CP24: 25.0000 mg | ORAL_CAPSULE | ORAL | 0 refills | Status: DC

## 2023-04-23 NOTE — Telephone Encounter (Signed)
Pt [facility] on [facility] XR [facility]  [facility]

## 2023-05-25 ENCOUNTER — Telehealth: Payer: Self-pay | Admitting: Family Medicine

## 2023-05-25 DIAGNOSIS — F902 Attention-deficit hyperactivity disorder, combined type: Secondary | ICD-10-CM

## 2023-05-25 NOTE — Telephone Encounter (Signed)
Needs Adderall XR 25    [facility] [address]

## 2023-05-26 MED ORDER — AMPHETAMINE-DEXTROAMPHET ER 25 MG PO CP24: 25.0000 mg | ORAL_CAPSULE | ORAL | 0 refills | Status: DC

## 2023-06-12 DIAGNOSIS — F411 Generalized anxiety disorder: Secondary | ICD-10-CM | POA: Diagnosis not present

## 2023-06-12 DIAGNOSIS — F819 Developmental disorder of scholastic skills, unspecified: Secondary | ICD-10-CM | POA: Diagnosis not present

## 2023-06-12 DIAGNOSIS — F4381 Prolonged grief disorder: Secondary | ICD-10-CM | POA: Diagnosis not present

## 2023-06-29 ENCOUNTER — Ambulatory Visit (HOSPITAL_COMMUNITY)
Admission: EM | Admit: 2023-06-29 | Discharge: 2023-06-29 | Disposition: A | Discharge status: Other Acute Inpatient Hospital | Payer: Medicaid Other | Attending: Licensed Clinical Social Worker | Admitting: Licensed Clinical Social Worker

## 2023-06-29 ENCOUNTER — Other Ambulatory Visit: Payer: Self-pay

## 2023-06-29 ENCOUNTER — Encounter (HOSPITAL_COMMUNITY): Payer: Self-pay

## 2023-06-29 ENCOUNTER — Emergency Department (HOSPITAL_COMMUNITY)
Admission: EM | Admit: 2023-06-29 | Discharge: 2023-06-30 | Disposition: A | Discharge status: Other Acute Inpatient Hospital | Payer: Medicaid Other | Attending: Emergency Medicine | Admitting: Emergency Medicine

## 2023-06-29 DIAGNOSIS — F909 Attention-deficit hyperactivity disorder, unspecified type: Secondary | ICD-10-CM | POA: Diagnosis not present

## 2023-06-29 DIAGNOSIS — S51812A Laceration without foreign body of left forearm, initial encounter: Secondary | ICD-10-CM | POA: Insufficient documentation

## 2023-06-29 DIAGNOSIS — S59912A Unspecified injury of left forearm, initial encounter: Secondary | ICD-10-CM | POA: Diagnosis present

## 2023-06-29 DIAGNOSIS — R4589 Other symptoms and signs involving emotional state: Secondary | ICD-10-CM | POA: Insufficient documentation

## 2023-06-29 DIAGNOSIS — X781XXA Intentional self-harm by knife, initial encounter: Secondary | ICD-10-CM | POA: Insufficient documentation

## 2023-06-29 DIAGNOSIS — F4325 Adjustment disorder with mixed disturbance of emotions and conduct: Secondary | ICD-10-CM | POA: Diagnosis not present

## 2023-06-29 DIAGNOSIS — F322 Major depressive disorder, single episode, severe without psychotic features: Secondary | ICD-10-CM | POA: Diagnosis not present

## 2023-06-29 DIAGNOSIS — F419 Anxiety disorder, unspecified: Secondary | ICD-10-CM | POA: Insufficient documentation

## 2023-06-29 DIAGNOSIS — Z9151 Personal history of suicidal behavior: Secondary | ICD-10-CM | POA: Diagnosis not present

## 2023-06-29 DIAGNOSIS — T1491XA Suicide attempt, initial encounter: Secondary | ICD-10-CM | POA: Diagnosis not present

## 2023-06-29 DIAGNOSIS — R441 Visual hallucinations: Secondary | ICD-10-CM | POA: Diagnosis not present

## 2023-06-29 DIAGNOSIS — R9431 Abnormal electrocardiogram [ECG] [EKG]: Secondary | ICD-10-CM | POA: Diagnosis not present

## 2023-06-29 DIAGNOSIS — S51832A Puncture wound without foreign body of left forearm, initial encounter: Secondary | ICD-10-CM | POA: Diagnosis not present

## 2023-06-29 DIAGNOSIS — R440 Auditory hallucinations: Secondary | ICD-10-CM | POA: Diagnosis not present

## 2023-06-29 DIAGNOSIS — F411 Generalized anxiety disorder: Secondary | ICD-10-CM

## 2023-06-29 NOTE — BH Assessment (Addendum)
Comprehensive Clinical Assessment (CCA) Note  06/30/2023 Ian [name] [id number]  Disposition: Rockney Ghee, NP, patient meets inpatient criteria. Patient will be sent to ED for medical clearance and then return to [facility].   The patient demonstrates the following risk factors for suicide: Chronic risk factors for suicide include: psychiatric disorder of depression and previous suicide attempts today attempted to cut wrist with kitchen knife . Acute risk factors for suicide include: family or marital conflict. Protective factors for this patient include: responsibility to others (children, family), coping skills, and hope for the future. Considering these factors, the overall suicide risk at this point appears to be high. Patient is not appropriate for outpatient follow up.  Ian [name] is a 22 year old male presenting as a voluntary walk-in to [facility] due to SI with attempt of cutting his left wrist. Patient denied HI and alcohol/drug usage. Patient is accompanied by [name]. Patient gave consent for mother to be present during assessment.   Patient states "I attempted to stab myself with a kitchen knife and it went deep down to the other side of my wrist". Patient reported trigger includes his girlfriend breaking up with him on today after being together for 1 year. Mother reported patient has outbursts approx every 2 weeks where he punches holes in walls and breaking glass objects. Mother also patient has never dealt with fathers death that occurred when patient was 22 years old and that its hard for patient to released pain from grief/loss. Patient reported worsening depressive symptoms. Patient reports intermittent sleep. Patient reports having a "grief appetite".   Patient is sees a therapist 2x monthly at [facility] and is receiving psych medications from PCP. Patient feels that medications are working. Patient denied prior psych hospitalizations and  self-harming behaviors.   Patient resides with mother and aunt.  Patient is currently employed at [employer] and [employer]. Patient denied work-related concerns. Patient denied access to guns. Patient was cooperative during assessment.   Chief Complaint:  Chief Complaint  Patient presents with   Suicidal   Visit Diagnosis:  Major depressive disorder   CCA Screening, Triage and Referral (STR)  Patient Reported Information How did you hear about [place]? Family/Friend  What Is the Reason for Your Visit/Call Today? Ian [name] is a 22 year old male presenting as a voluntary walk-in to [facility] due to SI with attempt of cutting his left wrist. Patient denied HI and alcohol/drug usage. Patient is accompanied by [name]. Patient gave consent for mother to be present during assessment. Patient states "I attempted to stab myself with a kitchen knife and it went deep down to the other side of my wrist". Patient reported trigger includes his girlfriend breaking up with him on today after being together for 1 year. Mother reported patient has outbursts approx every 2 weeks where he punches holes in walls and breaking glass objects. Patient is sees a therapist 2x monthly at [facility] and is receiving psych medications from PCP. Patient was cooperative during assessment.  How Long Has This Been Causing You Problems? > than 6 months  What Do You Feel Would Help You the Most Today? Treatment for Depression or other mood problem   Have You Recently Had Any Thoughts About Hurting Yourself? Yes  Are You Planning to Commit Suicide/Harm Yourself At This time? Yes   Flowsheet Row ED from 06/29/2023 in [facility] Most recent reading at 06/30/2023  1:45 AM ED  from 06/29/2023 in [facility] Emergency Department at [facility] Most recent reading at 06/29/2023 11:54 PM ED from 03/14/2023 in [facility] Urgent Care at [facility] Most  recent reading at 03/14/2023  1:31 PM  C-SSRS RISK CATEGORY High Risk Error: Q2 is Yes, you must answer 3, 4, and 5 No Risk       Have you Recently Had Thoughts About Hurting Someone [name]? No  Are You Planning to Harm Someone at This Time? No  Explanation: n/a   Have You Used Any Alcohol or Drugs in the Past 24 Hours? No  What Did You Use and How Much? N/a  Do You Currently Have a Therapist/Psychiatrist? N/a Name of Therapist/Psychiatrist: Name of Therapist/Psychiatrist: n/a   Have You Been Recently Discharged From Any Office Practice or Programs? No  Explanation of Discharge From Practice/Program: n/a     CCA Screening Triage Referral Assessment Type of Contact: Face-to-Face  Telemedicine Service Delivery:   Is this Initial or Reassessment?   Date Telepsych consult ordered in [facility]:    Time Telepsych consult ordered in [facility]:    Location of Assessment: [facility] Assessment Services  Provider Location: [facility] Assessment Services   Collateral Involvement: Vira Agar, mother   Does Patient Have a Court Appointed Legal Guardian? No  Legal Guardian Contact Information: n/a  Copy of Legal Guardianship Form: -- (n/a)  Legal Guardian Notified of Arrival: -- (n/a)  Legal Guardian Notified of Pending Discharge: -- (n/a)  If Minor and Not Living with Parent(s), Who has Custody? n/a  Is CPS involved or ever been involved? Never  Is APS involved or ever been involved? Never   Patient Determined To Be At Risk for Harm To Self or Others Based on Review of Patient Reported Information or Presenting Complaint? Yes, for Self-Harm  Method: Plan with intent and identified person  Availability of Means: In hand or used  Intent: Clearly intends on inflicting harm that could cause death  Notification Required: No need or identified person  Additional Information for Danger to Others Potential: -- (n/a)  Additional Comments for Danger to Others Potential: n/a  Are There  Guns or Other Weapons in Your Home? No  Types of Guns/Weapons: n/a  Are These Weapons Safely Secured?                            -- (n/a)  Who Could Verify You Are Able To Have These Secured: n/a  Do You Have any Outstanding Charges, Pending Court Dates, Parole/Probation? none reported  Contacted To Inform of Risk of Harm To Self or Others: Family/Significant Other:    Does Patient Present under Involuntary Commitment? No    [place] of Residence: [place]   Patient Currently Receiving the Following Services: Individual Therapy; Medication Management   Determination of Need: Emergent (2 hours)   Options For Referral: Inpatient Hospitalization; Outpatient Therapy; Medication Management     CCA Biopsychosocial Patient Reported Schizophrenia/Schizoaffective Diagnosis in Past: No   Strengths: n/a   Mental Health Symptoms Depression:   Hopelessness; Fatigue; Difficulty Concentrating   Duration of Depressive symptoms:  Duration of Depressive Symptoms: Less than two weeks   Mania:   None   Anxiety:    Worrying; Tension; Restlessness   Psychosis:   None   Duration of Psychotic symptoms:    Trauma:   None   Obsessions:   None   Compulsions:   None   Inattention:   None  Hyperactivity/Impulsivity:   None   Oppositional/Defiant Behaviors:   None; Aggression towards people/animals; Angry; Argumentative; Defies rules; Easily annoyed; Temper   Emotional Irregularity:   None   Other Mood/Personality Symptoms:   n/a    Mental Status Exam Appearance and self-care  Stature:   Average   Weight:   Average weight   Clothing:   Neat/clean   Grooming:   Normal   Cosmetic use:   None   Posture/gait:   Normal   Motor activity:   Not Remarkable   Sensorium  Attention:   Normal   Concentration:   Normal   Orientation:   X5   Recall/memory:   Normal   Affect and Mood  Affect:   Anxious; Appropriate; Depressed   Mood:    Anxious; Depressed; Hopeless   Relating  Eye contact:   Normal   Facial expression:   Depressed; Angry   Attitude toward examiner:   Cooperative   Thought and Language  Speech flow:  Clear and Coherent   Thought content:   Appropriate to Mood and Circumstances   Preoccupation:   None   Hallucinations:   Auditory   Organization:   Patent examiner of Knowledge:   Average   Intelligence:   Average   Abstraction:   Normal   Judgement:   Normal   Reality Testing:   Adequate   Insight:   Fair   Decision Making:   Impulsive   Social Functioning  Social Maturity:   Impulsive   Social Judgement:   Normal   Stress  Stressors:   Grief/losses; Transitions; School; Relationship   Coping Ability:  none  Skill Deficits:   Chief Operating Officer; Communication   Supports:   Family; Support needed     Religion: Religion/Spirituality Are You A Religious Person?: Yes How Might This Affect Treatment?: none  Leisure/Recreation: Leisure / Recreation Do You Have Hobbies?: Yes Leisure and Hobbies: "working out, Diplomatic Services operational officer down feelings"  Exercise/Diet: Exercise/Diet Do You Exercise?: Yes What Type of Exercise Do You Do?: Other (Comment) How Many Times a Week Do You Exercise?: 6-7 times a week (at school) Have You Gained or Lost A Significant Amount of Weight in the Past Six Months?: No Do You Follow a Special Diet?: No Do You Have Any Trouble Sleeping?: Yes Explanation of Sleeping Difficulties: intermittant sleep   CCA Employment/Education Employment/Work Situation: Employment / Work Situation Employment Situation: Surveyor, minerals Job has Been Impacted by Current Illness: No Has Patient ever Been in the [employer]?: No  Education: Education Is Patient Currently Attending School?: No Last Grade Completed: 12 Did You Product manager?: No Did You Have An Individualized Education Program (IIEP): No Did You Have Any  Difficulty At School?: No Patient's Education Has Been Impacted by Current Illness: No   CCA Family/Childhood History Family and Relationship History: Family history Marital status: Single Does patient have children?: No  Childhood History:  Childhood History By whom was/is the patient raised?: Mother, Father Did patient suffer any verbal/emotional/physical/sexual abuse as a child?: No Did patient suffer from severe childhood neglect?: No Has patient ever been sexually abused/assaulted/raped as an adolescent or adult?: No Was the patient ever a victim of a crime or a disaster?: No Witnessed domestic violence?: No Has patient been affected by domestic violence as an adult?: No       CCA Substance Use Alcohol/Drug Use: Alcohol / Drug Use Pain Medications: see MAR Prescriptions: see MAR Over the Counter: see MAR History of  alcohol / drug use?: No history of alcohol / drug abuse Longest period of sobriety (when/how long): n/a Negative Consequences of Use:  (n/a) Withdrawal Symptoms:  (n/a)                         ASAM's:  Six Dimensions of Multidimensional Assessment  Dimension 1:  Acute Intoxication and/or Withdrawal Potential:   Dimension 1:  Description of individual's past and current experiences of substance use and withdrawal: n/a  Dimension 2:  Biomedical Conditions and Complications:   Dimension 2:  Description of patient's biomedical conditions and  complications: n/a  Dimension 3:  Emotional, Behavioral, or Cognitive Conditions and Complications:  Dimension 3:  Description of emotional, behavioral, or cognitive conditions and complications: n/a  Dimension 4:  Readiness to Change:  Dimension 4:  Description of Readiness to Change criteria: n/a  Dimension 5:  Relapse, Continued use, or Continued Problem Potential:  Dimension 5:  Relapse, continued use, or continued problem potential critiera description: n/a  Dimension 6:  Recovery/Living Environment:   Dimension 6:  Recovery/Iiving environment criteria description: n/a  ASAM Severity Score:    ASAM Recommended Level of Treatment: ASAM Recommended Level of Treatment:  (n/a)   Substance use Disorder (SUD) Substance Use Disorder (SUD)  Checklist Symptoms of Substance Use:  (n/a)  Recommendations for Services/Supports/Treatments: Recommendations for Services/Supports/Treatments Recommendations For Services/Supports/Treatments: Inpatient Hospitalization, Individual Therapy, Medication Management  Discharge Disposition: Discharge Disposition Medical Exam completed: Yes Disposition of Patient: Admit  DSM5 Diagnoses: Patient Active Problem List   Diagnosis Date Noted   Gastroesophageal reflux disease 03/26/2018   Asthma, mild intermittent 03/21/2014   Acne 03/11/2013   Allergic rhinitis due to pollen 03/11/2013   ADHD (attention deficit hyperactivity disorder), combined type 11/03/2011     Referrals to Alternative Service(s): Referred to Alternative Service(s):   Place:   Date:   Time:    Referred to Alternative Service(s):   Place:   Date:   Time:    Referred to Alternative Service(s):   Place:   Date:   Time:    Referred to Alternative Service(s):   Place:   Date:   Time:     Burnetta Sabin, [facility]

## 2023-06-29 NOTE — Discharge Instructions (Signed)

## 2023-06-29 NOTE — ED Triage Notes (Signed)
Pt reports with suicidal thoughts; pt stabbed himself in the left forearm. Pt has a small laceration. Pt states that a lot has been going on.

## 2023-06-29 NOTE — ED Provider Notes (Incomplete)
Behavioral Health Urgent Care Medical Screening Exam  Patient Name: Ian [name] MRN: [id number] Date of Evaluation: 06/29/23 Chief Complaint:  "I stabbed myself on my left arm in a suicide attempt". Diagnosis:  Final diagnoses:  Suicide attempt, initial encounter [facility])  Emotional dysregulation  Anxiety state  Adjustment reaction with mixed disturbance of emotions and conduct    History of Present illness: Ian [name] is a 22 y.o. male with psychiatric history of ADHD, who presented voluntarily as a walk in to [facility], accompanied by his mother due to suicide attempt via self inflicted stab wound to the left hand due to a relationship break up.  Patient was seen face to face by this provider and chart reviewed. Patient was evaluated with his mother present.  On evaluation, patient is alert, oriented x 4, and cooperative. Speech is clear and pressured. Pt appears casually dressed. Eye contact is fair. Mood is anxious and depressed, affect is congruent with mood. Thought process is linear and circumstantial and thought content is WDL. Pt endorses active SI,  and passive HI, denies VH, endorses AH with mocking command voices. There is no objective indication that the patient is responding to internal stimuli. No delusions elicited during this assessment.     Patient has a constricted affect with slightly pressured speech. He is emotional and soon tears up, He reports " I stabbed myself on the left hand in a suicide attempt after a heartbreak". Left hand noted to be wrapped up in a clean dressing. He endorses anger and emotional/mood instability " almost ever since high school", and states " I break things and tear up the house".    He endorses occasional alcohol and weed use.   He is prescribed Adderell for ADHD and sees an outpatient psychiatric provider  Flowsheet Row ED from 03/14/2023 in [facility] Urgent Care at [facility] ED from 02/08/2023 in [facility] at  [facility] ED from 09/03/2022 in [facility] Emergency Department at [facility]  C-SSRS RISK CATEGORY No Risk No Risk No Risk       Psychiatric Specialty Exam  Presentation  General Appearance:Casual  Eye Contact:Fair  Speech:Pressured  Speech Volume:Normal  Handedness:Right   Mood and Affect  Mood: Anxious; Depressed  Affect: Constricted   Thought Process  Thought Processes: Linear  Descriptions of Associations:Circumstantial  Orientation:Partial  Thought Content:WDL    Hallucinations:None  Ideas of Reference:None  Suicidal Thoughts:Yes, Active With Intent; With Means to Carry Out; With Plan  Homicidal Thoughts:Yes, Passive Without Plan   Sensorium  Memory: Immediate Fair  Judgment: Poor  Insight: Poor   Executive Functions  Concentration: Fair  Attention Span: Fair  Recall: [employer] of Knowledge: Fair  Language: Fair   Psychomotor Activity  Psychomotor Activity: Normal   Assets  Assets: Manufacturing systems engineer; Desire for Improvement; Social Support   Sleep  Sleep: Poor  Number of hours: No data recorded  Physical Exam: Physical Exam Constitutional:      General: He is not in acute distress.    Appearance: He is not diaphoretic.  HENT:     Head: Normocephalic.     Right Ear: External ear normal.     Left Ear: External ear normal.     Nose: No congestion.  Eyes:     General:        Right eye: No discharge.        Left eye: No discharge.  Cardiovascular:     Rate and Rhythm: Normal rate.  Pulmonary:  Effort: No respiratory distress.  Chest:     Chest wall: No tenderness.  Neurological:     Mental Status: He is alert and oriented to person, place, and time.  Psychiatric:        Attention and Perception: He perceives auditory hallucinations.        Mood and Affect: Mood is anxious and depressed. Affect is tearful.        Speech: Speech is rapid and pressured.        Behavior: Behavior is  cooperative.        Thought Content: Thought content includes homicidal and suicidal ideation. Thought content includes suicidal plan. Thought content does not include homicidal plan.        Judgment: Judgment is impulsive and inappropriate.    Review of Systems  Constitutional:  Negative for chills, diaphoresis and fever.  HENT:  Negative for congestion.   Eyes:  Negative for discharge.  Respiratory:  Negative for cough, shortness of breath and wheezing.   Cardiovascular:  Negative for chest pain and palpitations.  Gastrointestinal:  Negative for diarrhea, nausea and vomiting.  Neurological:  Negative for seizures and headaches.  Psychiatric/Behavioral:  Positive for hallucinations, substance abuse and suicidal ideas. The patient is nervous/anxious.    Blood pressure (!) 145/77, pulse 78, temperature 97.8 F (36.6 C), temperature source Oral, resp. rate 18, SpO2 100%. There is no height or weight on file to calculate BMI.  Musculoskeletal: Strength & Muscle Tone: within normal limits Gait & Station: normal Patient leans: N/A   [facility] MSE Discharge Disposition for Follow up and Recommendations: Based on my evaluation the patient appears to have an emergency medical condition for which I recommend the patient be transferred to the emergency department for further evaluation.   Recommend transfer to [facility] for medical clearance due to stab wound to the left hand in a suicide attempt.  Patient may return to [facility] after medical clearance to continue his psychiatric care.   I spoke to Dr Estell Harpin at [facility] and the provider as agreed to accept the patient.    Mancel Bale, NP 06/29/2023, 11:01 PM

## 2023-06-29 NOTE — ED Notes (Signed)
Pt transferred to [facility] for medical clearance per provider and would come back to [facility] after that. Safe transport has been called for transport and report called to [place], the charge nurse. Dr Estell Harpin accepting.

## 2023-06-29 NOTE — ED Notes (Signed)
Pt transferred to [facility].

## 2023-06-29 NOTE — BH Assessment (Incomplete)
Ian [name] is a 23 year old male presenting as a voluntary walk-in to [facility] due to SI with attempt of cutting his left wrist. Patient denied HI and alcohol/drug usage. Patient is accompanied by [name]. Patient gave consent for mother to be present during assessment.   Patient reports
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
## 2023-06-30 ENCOUNTER — Emergency Department (HOSPITAL_COMMUNITY): Payer: Medicaid Other

## 2023-06-30 ENCOUNTER — Ambulatory Visit (HOSPITAL_COMMUNITY)
Admission: EM | Admit: 2023-06-30 | Discharge: 2023-06-30 | Disposition: A | Discharge status: Other Acute Inpatient Hospital | Payer: Medicaid Other | Source: Home / Self Care

## 2023-06-30 ENCOUNTER — Encounter: Payer: Self-pay | Admitting: Student in an Organized Health Care Education/Training Program

## 2023-06-30 ENCOUNTER — Inpatient Hospital Stay
Admission: AD | Admit: 2023-06-30 | Discharge: 2023-07-07 | DRG: 885 | Disposition: A | Discharge status: Home / Self Care | Payer: Medicaid Other | Source: Intra-hospital | Attending: Psychiatry | Admitting: Psychiatry

## 2023-06-30 DIAGNOSIS — Z5941 Food insecurity: Secondary | ICD-10-CM

## 2023-06-30 DIAGNOSIS — Z8249 Family history of ischemic heart disease and other diseases of the circulatory system: Secondary | ICD-10-CM

## 2023-06-30 DIAGNOSIS — F4325 Adjustment disorder with mixed disturbance of emotions and conduct: Secondary | ICD-10-CM

## 2023-06-30 DIAGNOSIS — R440 Auditory hallucinations: Secondary | ICD-10-CM | POA: Diagnosis present

## 2023-06-30 DIAGNOSIS — S51812A Laceration without foreign body of left forearm, initial encounter: Secondary | ICD-10-CM | POA: Diagnosis not present

## 2023-06-30 DIAGNOSIS — Z79899 Other long term (current) drug therapy: Secondary | ICD-10-CM | POA: Diagnosis not present

## 2023-06-30 DIAGNOSIS — T1491XA Suicide attempt, initial encounter: Secondary | ICD-10-CM

## 2023-06-30 DIAGNOSIS — G4700 Insomnia, unspecified: Secondary | ICD-10-CM | POA: Diagnosis present

## 2023-06-30 DIAGNOSIS — F322 Major depressive disorder, single episode, severe without psychotic features: Principal | ICD-10-CM | POA: Diagnosis present

## 2023-06-30 DIAGNOSIS — F332 Major depressive disorder, recurrent severe without psychotic features: Principal | ICD-10-CM | POA: Diagnosis present

## 2023-06-30 DIAGNOSIS — F419 Anxiety disorder, unspecified: Secondary | ICD-10-CM | POA: Diagnosis present

## 2023-06-30 DIAGNOSIS — S51832A Puncture wound without foreign body of left forearm, initial encounter: Secondary | ICD-10-CM | POA: Diagnosis not present

## 2023-06-30 DIAGNOSIS — F909 Attention-deficit hyperactivity disorder, unspecified type: Secondary | ICD-10-CM | POA: Diagnosis present

## 2023-06-30 DIAGNOSIS — Z823 Family history of stroke: Secondary | ICD-10-CM | POA: Diagnosis not present

## 2023-06-30 DIAGNOSIS — R4587 Impulsiveness: Secondary | ICD-10-CM | POA: Diagnosis present

## 2023-06-30 DIAGNOSIS — F819 Developmental disorder of scholastic skills, unspecified: Secondary | ICD-10-CM | POA: Diagnosis not present

## 2023-06-30 DIAGNOSIS — X781XXA Intentional self-harm by knife, initial encounter: Secondary | ICD-10-CM | POA: Insufficient documentation

## 2023-06-30 DIAGNOSIS — F411 Generalized anxiety disorder: Secondary | ICD-10-CM | POA: Diagnosis not present

## 2023-06-30 DIAGNOSIS — F4381 Prolonged grief disorder: Secondary | ICD-10-CM | POA: Diagnosis not present

## 2023-06-30 DIAGNOSIS — R45851 Suicidal ideations: Secondary | ICD-10-CM | POA: Diagnosis present

## 2023-06-30 LAB — CBC WITH DIFFERENTIAL/PLATELET
Abs Immature Granulocytes: 0.02 10*3/uL (ref 0.00–0.07)
Basophils Absolute: 0.1 10*3/uL (ref 0.0–0.1)
Basophils Relative: 1 %
Eosinophils Absolute: 0.3 10*3/uL (ref 0.0–0.5)
Eosinophils Relative: 4 %
HCT: 45.2 % (ref 39.0–52.0)
Hemoglobin: 15.3 g/dL (ref 13.0–17.0)
Immature Granulocytes: 0 %
Lymphocytes Relative: 27 %
Lymphs Abs: 1.9 10*3/uL (ref 0.7–4.0)
MCH: 29.2 pg (ref 26.0–34.0)
MCHC: 33.8 g/dL (ref 30.0–36.0)
MCV: 86.3 fL (ref 80.0–100.0)
Monocytes Absolute: 0.4 10*3/uL (ref 0.1–1.0)
Monocytes Relative: 6 %
Neutro Abs: 4.4 10*3/uL (ref 1.7–7.7)
Neutrophils Relative %: 62 %
Platelets: 247 10*3/uL (ref 150–400)
RBC: 5.24 MIL/uL (ref 4.22–5.81)
RDW: 13.1 % (ref 11.5–15.5)
WBC: 7 10*3/uL (ref 4.0–10.5)
nRBC: 0 % (ref 0.0–0.2)

## 2023-06-30 LAB — COMPREHENSIVE METABOLIC PANEL
ALT: 19 U/L (ref 0–44)
AST: 22 U/L (ref 15–41)
Albumin: 4.6 g/dL (ref 3.5–5.0)
Alkaline Phosphatase: 59 U/L (ref 38–126)
Anion gap: 11 (ref 5–15)
BUN: 13 mg/dL (ref 6–20)
CO2: 25 mmol/L (ref 22–32)
Calcium: 9.7 mg/dL (ref 8.9–10.3)
Chloride: 105 mmol/L (ref 98–111)
Creatinine, Ser: 0.85 mg/dL (ref 0.61–1.24)
GFR, Estimated: >60 mL/min (ref >60)
Glucose, Bld: 86 mg/dL (ref 70–99)
Potassium: 3.5 mmol/L (ref 3.5–5.1)
Sodium: 141 mmol/L (ref 135–145)
Total Bilirubin: 0.8 mg/dL (ref <1.2)
Total Protein: 7.7 g/dL (ref 6.5–8.1)

## 2023-06-30 LAB — POCT URINE DRUG SCREEN - MANUAL ENTRY (I-SCREEN)
POC Amphetamine UR: POSITIVE — AB
POC Buprenorphine (BUP): NOT DETECTED
POC Cocaine UR: NOT DETECTED
POC Marijuana UR: NOT DETECTED
POC Methadone UR: NOT DETECTED
POC Methamphetamine UR: NOT DETECTED
POC Morphine: NOT DETECTED
POC Oxazepam (BZO): NOT DETECTED
POC Oxycodone UR: NOT DETECTED
POC Secobarbital (BAR): NOT DETECTED

## 2023-06-30 LAB — TSH: TSH: 2.769 u[IU]/mL (ref 0.350–4.500)

## 2023-06-30 LAB — SALICYLATE LEVEL: Salicylate Lvl: <7 mg/dL — ABNORMAL LOW (ref 7.0–30.0)

## 2023-06-30 LAB — ACETAMINOPHEN LEVEL: Acetaminophen (Tylenol), Serum: <10 ug/mL — ABNORMAL LOW (ref 10–30)

## 2023-06-30 LAB — HEMOGLOBIN A1C
Hgb A1c MFr Bld: 5 % (ref 4.8–5.6)
Mean Plasma Glucose: 96.8 mg/dL

## 2023-06-30 LAB — ETHANOL: Alcohol, Ethyl (B): <10 mg/dL (ref <10)

## 2023-06-30 MED ORDER — ALUM & MAG HYDROXIDE-SIMETH 200-200-20 MG/5ML PO SUSP: 15.0000 mL | Freq: Four times a day (QID) | ORAL | Status: DC | PRN

## 2023-06-30 MED ORDER — HALOPERIDOL 5 MG PO TABS: 5.0000 mg | ORAL_TABLET | Freq: Three times a day (TID) | ORAL | Status: DC | PRN

## 2023-06-30 MED ORDER — AMPHETAMINE-DEXTROAMPHET ER 5 MG PO CP24
25.0000 mg | ORAL_CAPSULE | ORAL | Status: DC
  Filled 2023-06-30: qty 1

## 2023-06-30 MED ORDER — ACETAMINOPHEN 325 MG PO TABS: 650.0000 mg | ORAL_TABLET | Freq: Four times a day (QID) | ORAL | Status: DC | PRN

## 2023-06-30 MED ORDER — SERTRALINE HCL 25 MG PO TABS
25.0000 mg | ORAL_TABLET | Freq: Every day | ORAL | Status: DC
  Administered 2023-07-01: 25 mg via ORAL
  Filled 2023-06-30: qty 1

## 2023-06-30 MED ORDER — HYDROXYZINE HCL 25 MG PO TABS: 25.0000 mg | ORAL_TABLET | Freq: Three times a day (TID) | ORAL | Status: DC | PRN

## 2023-06-30 MED ORDER — AMPHETAMINE-DEXTROAMPHET ER 25 MG PO CP24: 25.0000 mg | ORAL_CAPSULE | ORAL | Status: DC

## 2023-06-30 MED ORDER — BACITRACIN ZINC 500 UNIT/GM EX OINT
TOPICAL_OINTMENT | Freq: Two times a day (BID) | CUTANEOUS | Status: DC
  Administered 2023-06-30: 1 via TOPICAL
  Filled 2023-06-30: qty 0.9

## 2023-06-30 MED ORDER — LORAZEPAM 2 MG/ML IJ SOLN: 2.0000 mg | Freq: Three times a day (TID) | INTRAMUSCULAR | Status: DC | PRN

## 2023-06-30 MED ORDER — SERTRALINE HCL 25 MG PO TABS: 25.0000 mg | ORAL_TABLET | Freq: Every day | ORAL | Status: DC

## 2023-06-30 MED ORDER — AMPHETAMINE-DEXTROAMPHET ER 5 MG PO CP24
25.0000 mg | ORAL_CAPSULE | ORAL | Status: DC
Start: 2023-06-30 — End: 2023-06-30
  Administered 2023-06-30: 25 mg via ORAL
  Filled 2023-06-30: qty 5

## 2023-06-30 MED ORDER — DIPHENHYDRAMINE HCL 25 MG PO CAPS: 50.0000 mg | ORAL_CAPSULE | Freq: Three times a day (TID) | ORAL | Status: DC | PRN

## 2023-06-30 MED ORDER — SERTRALINE HCL 25 MG PO TABS
25.0000 mg | ORAL_TABLET | Freq: Every day | ORAL | Status: DC
Start: 2023-06-30 — End: 2023-06-30
  Administered 2023-06-30: 25 mg via ORAL
  Filled 2023-06-30: qty 1

## 2023-06-30 MED ORDER — LIDOCAINE-EPINEPHRINE-TETRACAINE (LET) TOPICAL GEL
3.0000 mL | Freq: Once | TOPICAL | Status: AC
  Administered 2023-06-30: 3 mL via TOPICAL
  Filled 2023-06-30: qty 3

## 2023-06-30 MED ORDER — DIPHENHYDRAMINE HCL 50 MG/ML IJ SOLN: 50.0000 mg | Freq: Three times a day (TID) | INTRAMUSCULAR | Status: DC | PRN

## 2023-06-30 MED ORDER — HALOPERIDOL LACTATE 5 MG/ML IJ SOLN: 5.0000 mg | Freq: Three times a day (TID) | INTRAMUSCULAR | Status: DC | PRN

## 2023-06-30 MED ORDER — MAGNESIUM HYDROXIDE 400 MG/5ML PO SUSP: 15.0000 mL | Freq: Every day | ORAL | Status: DC | PRN

## 2023-06-30 MED ORDER — HALOPERIDOL LACTATE 5 MG/ML IJ SOLN: 10.0000 mg | Freq: Three times a day (TID) | INTRAMUSCULAR | Status: DC | PRN

## 2023-06-30 MED ORDER — HYDROXYZINE HCL 25 MG PO TABS
25.0000 mg | ORAL_TABLET | Freq: Three times a day (TID) | ORAL | Status: DC | PRN
Start: 2023-06-30 — End: 2023-06-30

## 2023-06-30 NOTE — ED Notes (Signed)
Pt resting quietly in view of nursing station.  Breathing even and unlabored.  Cont to monitor for safety.

## 2023-06-30 NOTE — Group Note (Signed)
Date:  06/30/2023 Time:  9:06 PM  Group Topic/Focus:  Recovery Goals:   The focus of this group is to identify appropriate goals for recovery and establish a plan to achieve them. Wrap-Up Group:   The focus of this group is to help patients review their daily goal of treatment and discuss progress on daily workbooks.    Participation Level:  Active  Participation Quality:  Appropriate and Attentive  Affect:  Appropriate  Cognitive:  Alert and Appropriate  Insight: Appropriate  Engagement in Group:  Limited  Modes of Intervention:  Discussion  Additional Comments:     [name],Maximo Spratling E 06/30/2023, 9:06 PM
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
## 2023-06-30 NOTE — Discharge Instructions (Addendum)
Transfer to [facility] for inpatient psychiatric admission, Dr. Renato Shin is the accepting MD

## 2023-06-30 NOTE — ED Notes (Signed)
Patient discharged in stable condition to [facility] via [facility] with all belongings. Patient denies SI, HI, AVH. Patient does not appeart to be responding to internal stimuli. Attempt to contact family with HIPAA compliant message left of patients transfer and phone number to contact.
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
## 2023-06-30 NOTE — ED Notes (Signed)
Report called to Land at [facility].  Safe transport called for transportation.
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
## 2023-06-30 NOTE — Progress Notes (Signed)
Pt calm and cooperative during assessment denying SI/HI/AVH. Pt observed interacting appropriately with staff and peers on the unit. Pt didn't have any medications scheduled tonight and hasn't requested anything PRN as of now. Pt given education, support, and encouragement to be active in his treatment plan. Pt being monitored Q 15 minutes for safety per unit protocol, remains safe on the unit
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
## 2023-06-30 NOTE — Progress Notes (Signed)
Pt was accepted to [facility]  06/30/2023 Bed Assignment 325  Address: [address] [place], [place], [place] [address]  BMU FAX Number [phone]  Pt meets inpatient criteria per: Luan Moore MD   Attending Physician will be Dr. Shellee Milo  Report can be called to: [phone]  Pt can arrive pending consent  Care Team notified:Danika [facility], Margo Common Maniattu RN, Arnette Felts RN, Bedelia Person RN, Luan Moore MD   [place] Jameel Quant, MSW, [facility] 06/30/2023 10:26 AM

## 2023-06-30 NOTE — Discharge Instructions (Addendum)
You were seen in the Emergency Department today for evaluation of your laceration. I am glad we were able to repair this for you. Please make sure that you are remembering to keep your wound clean daily with Dial soap and water and daily bandage changes. I recommend keeping the wound covered for the next 48-72 hours and then to your comfort afterwards. Do not expose the wound to any dishwater, pools, lakes, oceans, [employer], dirt or grime. Keeping the wound clean and away from contamination can help ensure good wound healing and help to prevent infections. You will need to return in 7-10 days for suture removal. This can be down at your primary care office, urgent care, or the ER.  For pain, I recommend Tylenol 1000mg  and/or ibuprofen 600mg  every 6 hours as needed for pain. If you have any concerns, new or worsening symptoms, please return to the nearest ER for re-evaluation.

## 2023-06-30 NOTE — Group Note (Signed)
[facility] LCSW Group Therapy Note   Group Date: 06/30/2023 Start Time: 1300 End Time: 1400  Type of Therapy/Topic:  Group Therapy:  Feelings about Diagnosis  Participation Level:  Did Not Attend   Description of Group:    This group will allow patients to explore their thoughts and feelings about diagnoses they have received. Patients will be guided to explore their level of understanding and acceptance of these diagnoses. Facilitator will encourage patients to process their thoughts and feelings about the reactions of others to their diagnosis, and will guide patients in identifying ways to discuss their diagnosis with significant others in their lives. This group will be process-oriented, with patients participating in exploration of their own experiences as well as giving and receiving support and challenge from other group members.   Therapeutic Goals: 1. Patient will demonstrate understanding of diagnosis as evidence by identifying two or more symptoms of the disorder:  2. Patient will be able to express two feelings regarding the diagnosis 3. Patient will demonstrate ability to communicate their needs through discussion and/or role plays  Summary of Patient Progress: X   Therapeutic Modalities:   Cognitive Behavioral Therapy Brief Therapy Feelings Identification    Harden Mo, LCSW

## 2023-06-30 NOTE — ED Notes (Signed)
Pt is awake and alert. Sitting beside the phone.  He has blunted affect and depressed mood. Pt is redirectable and answers questions appropriately.  Reports that he "had a suicide attempt last night and am not real sure how I got here".  Pt continues to be monitored for safety.
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
## 2023-06-30 NOTE — Group Note (Signed)
Date:  06/30/2023 Time:  3:42 PM  Group Topic/Focus:  Overcoming Stress:   The focus of this group is to define stress and help patients assess their triggers.    Participation Level:  Active  Participation Quality:  Appropriate  Affect:  Appropriate  Cognitive:  Appropriate  Insight: Appropriate  Engagement in Group:  Engaged  Modes of Intervention:  Activity  Additional Comments:    Mary Sella Caitlynne Harbeck 06/30/2023, 3:42 PM

## 2023-07-01 DIAGNOSIS — F322 Major depressive disorder, single episode, severe without psychotic features: Secondary | ICD-10-CM

## 2023-07-01 MED ORDER — AMPHETAMINE-DEXTROAMPHET ER 5 MG PO CP24
25.0000 mg | ORAL_CAPSULE | ORAL | Status: DC
Start: 2023-07-01 — End: 2023-07-01
  Administered 2023-07-01: 25 mg via ORAL
  Filled 2023-07-01: qty 5

## 2023-07-01 MED ORDER — TRAZODONE HCL 50 MG PO TABS: 50.0000 mg | ORAL_TABLET | Freq: Every day | ORAL | Status: DC

## 2023-07-01 MED ORDER — SERTRALINE HCL 25 MG PO TABS
50.0000 mg | ORAL_TABLET | Freq: Every day | ORAL | Status: DC
Start: 2023-07-02 — End: 2023-07-07
  Administered 2023-07-02 – 2023-07-07 (×6): 50 mg via ORAL
  Filled 2023-07-01 (×6): qty 2

## 2023-07-01 MED ORDER — MELATONIN 5 MG PO TABS
5.0000 mg | ORAL_TABLET | Freq: Every day | ORAL | Status: DC
  Administered 2023-07-01 – 2023-07-06 (×6): 5 mg via ORAL
  Filled 2023-07-01 (×6): qty 1

## 2023-07-01 MED ORDER — RISPERIDONE 1 MG PO TABS
0.5000 mg | ORAL_TABLET | Freq: Every day | ORAL | Status: DC
  Administered 2023-07-01 – 2023-07-03 (×3): 0.5 mg via ORAL
  Filled 2023-07-01 (×3): qty 1

## 2023-07-01 MED ORDER — TRAZODONE HCL 50 MG PO TABS: 50.0000 mg | ORAL_TABLET | Freq: Every evening | ORAL | Status: DC | PRN

## 2023-07-01 MED ORDER — LAMOTRIGINE 25 MG PO TABS
25.0000 mg | ORAL_TABLET | Freq: Every day | ORAL | Status: DC
  Administered 2023-07-01 – 2023-07-07 (×7): 25 mg via ORAL
  Filled 2023-07-01 (×7): qty 1

## 2023-07-01 NOTE — BH IP Treatment Plan (Signed)
Interdisciplinary Treatment and Diagnostic Plan Update  07/01/2023 Time of Session: 09:28 Ian [name] MRN: [id number]  Principal Diagnosis: MDD (major depressive disorder), severe (HCC)  Secondary Diagnoses: Principal Problem:   MDD (major depressive disorder), severe (HCC)   Current Medications:  Current Facility-Administered Medications  Medication Dose Route Frequency Provider Last Rate Last Admin   acetaminophen (TYLENOL) tablet 650 mg  650 mg Oral Q6H PRN Ardis Hughs, NP       alum & mag hydroxide-simeth (MAALOX/MYLANTA) 200-200-20 MG/5ML suspension 15 mL  15 mL Oral Q6H PRN Ardis Hughs, NP       amphetamine-dextroamphetamine (ADDERALL XR) 24 hr capsule 25 mg  25 mg Oral Alton Revere, Daphine Deutscher, MD   25 mg at 07/01/23 1009   haloperidol (HALDOL) tablet 5 mg  5 mg Oral TID PRN Ardis Hughs, NP       And   diphenhydrAMINE (BENADRYL) capsule 50 mg  50 mg Oral TID PRN Ardis Hughs, NP       haloperidol lactate (HALDOL) injection 5 mg  5 mg Intramuscular TID PRN Ardis Hughs, NP       And   diphenhydrAMINE (BENADRYL) injection 50 mg  50 mg Intramuscular TID PRN Ardis Hughs, NP       And   LORazepam (ATIVAN) injection 2 mg  2 mg Intramuscular TID PRN Ardis Hughs, NP       haloperidol lactate (HALDOL) injection 10 mg  10 mg Intramuscular TID PRN Ardis Hughs, NP       And   diphenhydrAMINE (BENADRYL) injection 50 mg  50 mg Intramuscular TID PRN Ardis Hughs, NP       And   LORazepam (ATIVAN) injection 2 mg  2 mg Intramuscular TID PRN Ardis Hughs, NP       hydrOXYzine (ATARAX) tablet 25 mg  25 mg Oral TID PRN Ardis Hughs, NP       sertraline (ZOLOFT) tablet 25 mg  25 mg Oral Daily Ardis Hughs, NP   25 mg at 07/01/23 6578   PTA Medications: Medications Prior to Admission  Medication Sig Dispense Refill Last Dose   amphetamine-dextroamphetamine (ADDERALL XR) 25 MG 24 hr capsule Take 1 capsule by  mouth every morning. 30 capsule 0    Multiple Vitamins-Minerals (MULTIVITAMIN GUMMIES ADULTS PO) Take 1 tablet by mouth daily.      sertraline (ZOLOFT) 25 MG tablet Take 1 tablet (25 mg total) by mouth daily.       Patient Stressors:    Patient Strengths:    Treatment Modalities: Medication Management, Group therapy, Case management,  1 to 1 session with clinician, Psychoeducation, Recreational therapy.   Physician Treatment Plan for Primary Diagnosis: MDD (major depressive disorder), severe (HCC) Long Term Goal(s):     Short Term Goals:    Medication Management: Evaluate patient's response, side effects, and tolerance of medication regimen.  Therapeutic Interventions: 1 to 1 sessions, Unit Group sessions and Medication administration.  Evaluation of Outcomes: Not Met  Physician Treatment Plan for Secondary Diagnosis: Principal Problem:   MDD (major depressive disorder), severe (HCC)  Long Term Goal(s):     Short Term Goals:       Medication Management: Evaluate patient's response, side effects, and tolerance of medication regimen.  Therapeutic Interventions: 1 to 1 sessions, Unit Group sessions and Medication administration.  Evaluation of Outcomes: Not Met   RN Treatment Plan for Primary Diagnosis: MDD (major depressive disorder), severe (HCC) Long  Term Goal(s): Knowledge of disease and therapeutic regimen to maintain health will improve  Short Term Goals: Ability to remain free from injury will improve, Ability to verbalize frustration and anger appropriately will improve, Ability to demonstrate self-control, Ability to participate in decision making will improve, Ability to verbalize feelings will improve, Ability to disclose and discuss suicidal ideas, Ability to identify and develop effective coping behaviors will improve, and Compliance with prescribed medications will improve  Medication Management: RN will administer medications as ordered by provider, will assess  and evaluate patient's response and provide education to patient for prescribed medication. RN will report any adverse and/or side effects to prescribing provider.  Therapeutic Interventions: 1 on 1 counseling sessions, Psychoeducation, Medication administration, Evaluate responses to treatment, Monitor vital signs and CBGs as ordered, Perform/monitor CIWA, COWS, AIMS and Fall Risk screenings as ordered, Perform wound care treatments as ordered.  Evaluation of Outcomes: Not Met   LCSW Treatment Plan for Primary Diagnosis: MDD (major depressive disorder), severe (HCC) Long Term Goal(s): Safe transition to appropriate next level of care at discharge, Engage patient in therapeutic group addressing interpersonal concerns.  Short Term Goals: Engage patient in aftercare planning with referrals and resources, Increase social support, Increase ability to appropriately verbalize feelings, Increase emotional regulation, Facilitate acceptance of mental health diagnosis and concerns, and Increase skills for wellness and recovery  Therapeutic Interventions: Assess for all discharge needs, 1 to 1 time with Social worker, Explore available resources and support systems, Assess for adequacy in community support network, Educate family and significant other(s) on suicide prevention, Complete Psychosocial Assessment, Interpersonal group therapy.  Evaluation of Outcomes: Not Met   Progress in Treatment: Attending groups: Yes. Participating in groups: Yes. Taking medication as prescribed: Yes. Toleration medication: Yes. Family/Significant other contact made: No, will contact:  if given permission. Patient understands diagnosis: Yes. Discussing patient identified problems/goals with staff: Yes. Medical problems stabilized or resolved: Yes. Denies suicidal/homicidal ideation: Yes. Issues/concerns per patient self-inventory: No. Other: none.  New problem(s) identified: No, Describe:  none identified.  New  Short Term/Long Term Goal(s): medication management for mood stabilization; elimination of SI thoughts; development of comprehensive mental wellness/sobriety plan.  Patient Goals:  "Really just to get out of here and back on my feet."  Discharge Plan or Barriers: CSW will assist pt with development of an appropriate aftercare/discharge plan.   Reason for Continuation of Hospitalization: Depression Medication stabilization Suicidal ideation  Estimated Length of Stay: 1-7 days  Last 3 [place] Suicide Severity Risk Score: Flowsheet Row Admission (Current) from 06/30/2023 in [facility] INPATIENT BE[facility]AL MEDICINE Most recent reading at 06/30/2023  2:00 PM ED from 06/30/2023 in [facility] Most recent reading at 06/30/2023  6:50 AM ED from 06/29/2023 in [facility] Emergency Department at [facility] Most recent reading at 06/30/2023  4:13 AM  C-SSRS RISK CATEGORY High Risk No Risk Error: Q7 should not be populated when Q6 is No       Last PHQ 2/9 Scores:    02/09/2023   11:04 AM 07/22/2022    1:17 PM 07/30/2020    8:30 AM  Depression screen PHQ 2/9  Decreased Interest 2 0 0  Down, Depressed, Hopeless 3 0 0  PHQ - 2 Score 5 0 0  Altered sleeping 3    Tired, decreased energy 3    Change in appetite 2    Feeling bad or failure about yourself  3    Trouble concentrating 2    Moving slowly or  fidgety/restless 2    Suicidal thoughts 3    PHQ-9 Score 23    Difficult doing work/chores Not difficult at all      Scribe for Treatment Team: Glenis Smoker, LCSW 07/01/2023 10:15 AM

## 2023-07-01 NOTE — Progress Notes (Signed)
   07/01/23 0900  Psych Admission Type (Psych Patients Only)  Admission Status Voluntary  Psychosocial Assessment  Patient Complaints Anxiety;Depression  Eye Contact Fair  Facial Expression Flat  Affect Anxious  Speech Logical/coherent  Interaction Assertive  Motor Activity Slow  Appearance/Hygiene Unremarkable;In scrubs  Behavior Characteristics Cooperative;Appropriate to situation  Mood Anxious;Sad;Preoccupied  Thought Process  Coherency WDL  Content Blaming self  Delusions None reported or observed  Perception WDL  Hallucination None reported or observed  Judgment Impaired  Confusion None  Danger to Self  Current suicidal ideation? Denies  Agreement Not to Harm Self Yes  Description of Agreement verbal  Danger to Others  Danger to Others None reported or observed

## 2023-07-01 NOTE — Group Note (Signed)
Date:  07/01/2023 Time:  3:27 PM  Group Topic/Focus:  Healthy Communication:   The focus of this group is to discuss communication, barriers to communication, as well as healthy ways to communicate with others.    Participation Level:  Active  Participation Quality:  Appropriate  Affect:  Appropriate  Cognitive:  Appropriate  Insight: Appropriate  Engagement in Group:  Engaged  Modes of Intervention:  Activity  Additional Comments:    Ian [name] 07/01/2023, 3:27 PM

## 2023-07-01 NOTE — Group Note (Signed)
LCSW Group Therapy Note   Group Date: 07/01/2023 Start Time: 1300 End Time: 1400   Type of Therapy and Topic:  Group Therapy: Challenging Core Beliefs  Participation Level:  Active  Description of Group:  Patients were educated about core beliefs and asked to identify one harmful core belief that they have. Patients were asked to explore from where those beliefs originate. Patients were asked to discuss how those beliefs make them feel and the resulting behaviors of those beliefs. They were then be asked if those beliefs are true and, if so, what evidence they have to support them. Lastly, group members were challenged to replace those negative core beliefs with helpful beliefs.   Therapeutic Goals:   1. Patient will identify harmful core beliefs and explore the origins of such beliefs. 2. Patient will identify feelings and behaviors that result from those core beliefs. 3. Patient will discuss whether such beliefs are true. 4.  Patient will replace harmful core beliefs with helpful ones.  Summary of Patient Progress:  Patient actively engaged in processing and exploring how core beliefs are formed and how they impact thoughts, feelings, and behaviors. Patient proved open to input from peers and feedback from CSW. Patient demonstrated proficient  insight into the subject matter, was respectful and supportive of peers, and participated throughout the entire session.  Therapeutic Modalities: Cognitive Behavioral Therapy; Solution-Focused Therapy   Lowry Ram, LCSWA 07/01/2023  3:09 PM

## 2023-07-01 NOTE — Group Note (Signed)
Date:  07/01/2023 Time:  10:34 AM  Group Topic/Focus:  Building Self Esteem:   The Focus of this group is helping patients become aware of the effects of self-esteem on their lives, the things they and others do that enhance or undermine their self-esteem, seeing the relationship between their level of self-esteem and the choices they make and learning ways to enhance self-esteem.    Participation Level:  Active  Participation Quality:  Appropriate  Affect:  Appropriate  Cognitive:  Appropriate  Insight: Appropriate  Engagement in Group:  Engaged  Modes of Intervention:  Activity  Additional Comments:    Mary Sella Zarion Oliff 07/01/2023, 10:34 AM

## 2023-07-01 NOTE — Plan of Care (Signed)
  Problem: Education: Goal: Knowledge of [facility] Education information/materials will improve Outcome: Progressing Goal: Emotional status will improve Outcome: Progressing Goal: Mental status will improve Outcome: Progressing Goal: Verbalization of understanding the information provided will improve Outcome: Progressing   Problem: Activity: Goal: Interest or engagement in activities will improve Outcome: Progressing Goal: Sleeping patterns will improve Outcome: Progressing   Problem: Coping: Goal: Ability to verbalize frustrations and anger appropriately will improve Outcome: Progressing Goal: Ability to demonstrate self-control will improve Outcome: Progressing   Problem: Health Behavior/Discharge Planning: Goal: Identification of resources available to assist in meeting health care needs will improve Outcome: Progressing Goal: Compliance with treatment plan for underlying cause of condition will improve Outcome: Progressing   Problem: Physical Regulation: Goal: Ability to maintain clinical measurements within normal limits will improve Outcome: Progressing   Problem: Safety: Goal: Periods of time without injury will increase Outcome: Progressing   Problem: Education: Goal: Utilization of techniques to improve thought processes will improve Outcome: Progressing Goal: Knowledge of the prescribed therapeutic regimen will improve Outcome: Progressing   Problem: Activity: Goal: Interest or engagement in leisure activities will improve Outcome: Progressing Goal: Imbalance in normal sleep/wake cycle will improve Outcome: Progressing   Problem: Coping: Goal: Coping ability will improve Outcome: Progressing Goal: Will verbalize feelings Outcome: Progressing   Problem: Health Behavior/Discharge Planning: Goal: Ability to make decisions will improve Outcome: Progressing Goal: Compliance with therapeutic regimen will improve Outcome: Progressing    Problem: Role Relationship: Goal: Will demonstrate positive changes in social behaviors and relationships Outcome: Progressing   Problem: Safety: Goal: Ability to disclose and discuss suicidal ideas will improve Outcome: Progressing Goal: Ability to identify and utilize support systems that promote safety will improve Outcome: Progressing   Problem: Self-Concept: Goal: Will verbalize positive feelings about self Outcome: Progressing Goal: Level of anxiety will decrease Outcome: Progressing   
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
## 2023-07-01 NOTE — Progress Notes (Signed)
Pt calm and cooperative during assessment denying SI/HI/AVH. Pt observed interacting appropriately with staff and peers on the unit. Pt compliant with medication administration per MD orders. Pt given education, support, and encouragement to be active in his treatment plan. Pt being monitored Q 15 minutes for safety per unit protocol, remains safe on the unit

## 2023-07-01 NOTE — Group Note (Signed)
Recreation Therapy Group Note   Group Topic:Relaxation  Group Date: 07/01/2023 Start Time: 1000 End Time: 1050 Facilitators: Rosina Lowenstein, LRT, CTRS Location:  [facility]  Group Description: PMR (Progressive Muscle Relaxation). LRT asks patients their current level of stress/anxiety from 1-10, with 10 being the highest. LRT educates patients on what PMR is and the benefits that come from it. Patients are asked to sit with their feet flat on the floor while sitting up and all the way back in their chair, if possible. LRT and pts follow a prompt through a speaker that requires you to tense and release different muscles in their body and focus on their breathing. During session, lights are off and soft music is being played. Pts are given a stress ball to use if needed. At the end of the prompt, LRT asks patients to rank their current levels of stress/anxiety from 1-10, 10 being the highest. LRT provides patients with an education handout on PMR.   Goal Area(s) Addressed:  Patients will be able to describe progressive muscle relaxation.  Patient will practice using relaxation technique. Patient will identify a new coping skill.  Patient will follow multistep directions to reduce anxiety and stress.   Affect/Mood: Appropriate   Participation Level: Active and Engaged   Participation Quality: Independent   Behavior: Calm and Cooperative   Speech/Thought Process: Coherent   Insight: Good   Judgement: Good   Modes of Intervention: Activity, Education, and Exploration   Patient Response to Interventions:  Attentive, Engaged, and Requested additional information/resources    Education Outcome:  Acknowledges education   Clinical Observations/Individualized Feedback: Ian [name] was active in their participation of session activities and group discussion. Pt identified that his anxiety was a 5 and stress was a 2 before the session. Afterwards, he rated his anxiety a 3 and stress a 1. Pt  received a stress ball after group. Pt interacted well with LRT and peers duration of session.    Plan: Continue to engage patient in RT group sessions 2-3x/week.   Rosina Lowenstein, LRT, CTRS 07/01/2023 1:03 PM

## 2023-07-01 NOTE — [facility] Suicide Risk Assessment (Signed)
[facility] Admission Suicide Risk Assessment   Nursing information obtained from:  Patient Demographic factors:  Male Current Mental Status:  NA Loss Factors:  NA Historical Factors:  Prior suicide attempts Risk Reduction Factors:  Sense of responsibility to family, Positive social support  Total Time spent with patient: 2 hours Principal Problem: MDD (major depressive disorder), severe (HCC) Diagnosis:  Principal Problem:   MDD (major depressive disorder), severe (HCC)  Subjective Data: 22 year old [place] American male presenting voluntarily with active suicidal ideation (SI) and a recent suicide attempt involving a kitchen knife. He reports "I attempted to stab myself with a kitchen knife and it went deep down to the other side of my wrist." The trigger for this attempt was his girlfriend breaking up with him earlier today after a year-long relationship. The patient has a history of depressive symptoms, emotional instability, and unresolved grief from his father's death at age 39. He endorses worsening depressive symptoms, intermittent sleep, and a "grief appetite."The patient denies homicidal ideation (HI), alcohol or drug use, and access to firearms. He is currently employed, resides with family, and sees a therapist twice monthly. His mother reports frequent emotional outbursts approximately every two weeks. The patient denies previous psychiatric hospitalizations but acknowledges active auditory hallucinations (AH) involving mocking command voices.  Continued Clinical Symptoms:    The "Alcohol Use Disorders Identification Test", Guidelines for Use in Primary Care, Second Edition.  [employer] writer [facility]). Score between 0-7:  no or low risk or alcohol related problems. Score between 8-15:  moderate risk of alcohol related problems. Score between 16-19:  high risk of alcohol related problems. Score 20 or above:  warrants further diagnostic evaluation for alcohol dependence and  treatment.   CLINICAL FACTORS:   Depression:   Anhedonia Comorbid alcohol abuse/dependence Hopelessness Impulsivity Insomnia Alcohol/Substance Abuse/Dependencies More than one psychiatric diagnosis   Musculoskeletal: Strength & Muscle Tone: within normal limits Gait & Station: normal Patient leans: Right and N/A  Psychiatric Specialty Exam:  Presentation  General Appearance:  Fairly Groomed  Eye Contact: Minimal  Speech: Clear and Coherent; Pressured  Speech Volume: Normal  Handedness: Right   Mood and Affect  Mood: Anxious  Affect: Flat   Thought Process  Thought Processes: Coherent  Descriptions of Associations:Intact  Orientation:Full (Time, Place and Person)  Thought Content:WDL  History of Schizophrenia/Schizoaffective disorder:No  Duration of Psychotic Symptoms:No data recorded Hallucinations:Hallucinations: Auditory Description of Auditory Hallucinations: "they just telling me about my life and what to do"  Ideas of Reference:None  Suicidal Thoughts:Suicidal Thoughts: Yes, Passive SI Active Intent and/or Plan: Without Intent; Without Access to Means; Without Plan SI Passive Intent and/or Plan: Without Intent; Without Access to Means; Without Means to Carry Out  Homicidal Thoughts:Homicidal Thoughts: No HI Passive Intent and/or Plan: -- (denies)   Sensorium  Memory: Immediate Fair; Remote Fair  Judgment: Poor  Insight: Poor   Executive Functions  Concentration: Fair  Attention Span: Fair  Recall: Fair  Fund of Knowledge: Good  Language: Good   Psychomotor Activity  Psychomotor Activity: Psychomotor Activity: Normal   Assets  Assets: Housing; Health and safety inspector; Communication Skills   Sleep  Sleep: Sleep: Good Number of Hours of Sleep: 6    Physical Exam: Physical Exam Vitals and nursing note reviewed.  Constitutional:      Appearance: Normal appearance.  HENT:     Head:  Normocephalic and atraumatic.     Nose: Nose normal.  Pulmonary:     Effort: Pulmonary effort is normal.  Musculoskeletal:  General: Normal range of motion.     Cervical back: Normal range of motion.  Neurological:     Mental Status: He is alert.  Psychiatric:        Attention and Perception: Attention normal. He perceives auditory hallucinations.        Mood and Affect: Mood is anxious and depressed.        Speech: Speech is rapid and pressured.        Behavior: Behavior normal. Behavior is cooperative.        Thought Content: Thought content includes suicidal ideation.        Cognition and Memory: Cognition and memory normal.        Judgment: Judgment is impulsive.    ROS Blood pressure 124/78, pulse 71, temperature 97.7 F (36.5 C), temperature source Oral, resp. rate 19, height 5\' 7"  (1.702 m), weight 70.8 kg, SpO2 98%. Body mass index is 24.43 kg/m.   COGNITIVE FEATURES THAT CONTRIBUTE TO RISK:  None    SUICIDE RISK:   Minimal: No identifiable suicidal ideation.  Patients presenting with no risk factors but with morbid ruminations; may be classified as minimal risk based on the severity of the depressive symptoms  PLAN OF CARE:  Initiate Sertraline (Zoloft) 50 mg daily for depression. Initiate Risperidone 0.5mg  nightly to address auditory hallucinations and mood instability. Initiate Lamotrigine 25 mg daily, titrate as tolerated for mood stabilization. Discontinue Adderall XR 25 mg daily to reduce risk of exacerbating anxiety or psychosis. Prescribe Melatonin 5 mg nightly for sleep support. Psychoeducation on coping mechanisms for depressive symptoms and auditory hallucinations.  I certify that inpatient services furnished can reasonably be expected to improve the patient's condition.   Myriam Forehand, NP 07/01/2023, 2:43 PM

## 2023-07-01 NOTE — Group Note (Signed)
Date:  07/01/2023 Time:  9:41 PM  Group Topic/Focus:  Stages of Change:   The focus of this group is to explain the stages of change and help patients identify changes they want to make upon discharge.    Participation Level:  Active  Participation Quality:  Appropriate, Attentive, Sharing, and Supportive  Affect:  Appropriate  Cognitive:  Alert and Appropriate  Insight: Appropriate, Good, and Improving  Engagement in Group:  Developing/Improving and Engaged  Modes of Intervention:  Activity, Clarification, Discussion, Education, Rapport Building, Dance movement psychotherapist, Role-play, and Support  Additional Comments:     Taquisha Phung 07/01/2023, 9:41 PM

## 2023-07-01 NOTE — H&P (Addendum)
Psychiatric Admission Assessment Adult  Patient Identification: Ian [name] MRN:  [id number] Date of Evaluation:  07/01/2023 Chief Complaint:  MDD (major depressive disorder), severe (HCC) [F32.2] Principal Diagnosis: MDD (major depressive disorder), severe (HCC) Diagnosis:  Principal Problem:   MDD (major depressive disorder), severe (HCC)  History of Present Illness: 22 year old African American male presenting voluntarily due to active suicidal ideation (SI) following a suicide attempt earlier today. He reports, "I attempted to stab myself with a kitchen knife and it went deep down to the other side of my wrist." The incident was triggered by a breakup with his girlfriend earlier today after a one-year relationship. The patient describes a worsening of depressive symptoms leading up to this event, including intermittent sleep disturbances and a "grief appetite." He denies any known additional triggers.The patient has a history of emotional instability, with frequent outbursts reported by his mother every two weeks, including punching walls and breaking glass objects. His mother attributes some of his emotional challenges to unresolved grief from his father's death at age 9, which the patient has never fully processed. He denies work-related stress and reports feeling supported by his mother and aunt, with whom he resides.The patient denies homicidal ideation (HI), alcohol or drug use, or access to firearms but acknowledges auditory hallucinations (AH) with mocking, command-like voices. He denies visual hallucinations (VH) or delusional thoughts. The patient is currently seeing a therapist twice a month and receiving medications (Sertraline and Adderall) from his PCP, which he reports have been somewhat helpful.Despite current support and ongoing therapy, the patient continues to struggle with anger, mood instability, and depressive symptoms, which have culminated in today's suicide attempt. He is  cooperative during the assessment and acknowledges the need for further intervention. Associated Signs/Symptoms: Depression Symptoms:  depressed mood, insomnia, feelings of worthlessness/guilt, difficulty concentrating, hopelessness, suicidal thoughts with specific plan, suicidal attempt, anxiety, (Hypo) Manic Symptoms:  Elevated Mood, Impulsivity, Irritable Mood, Anxiety Symptoms:  Excessive Worry, Psychotic Symptoms:  Hallucinations: Auditory PTSD Symptoms: Negative Total Time spent with patient: 1.5 hours  Past Psychiatric History:ADHD Anxiety  Is the patient at risk to self? Yes.    Has the patient been a risk to self in the past 6 months? No.  Has the patient been a risk to self within the distant past? No.  Is the patient a risk to others? No.  Has the patient been a risk to others in the past 6 months? No.  Has the patient been a risk to others within the distant past? No.   [place] Scale:  Flowsheet Row Admission (Current) from 06/30/2023 in [facility] INPATIENT BEHAVIORAL MEDICINE Most recent reading at 06/30/2023  2:00 PM ED from 06/30/2023 in [facility] Most recent reading at 06/30/2023  6:50 AM ED from 06/29/2023 in [facility] Emergency Department at [facility] Most recent reading at 06/30/2023  4:13 AM  C-SSRS RISK CATEGORY High Risk No Risk Error: Q7 should not be populated when Q6 is No        Prior Inpatient Therapy: Yes.   If yes, describe inpatient  Prior Outpatient Therapy: Yes.   If yes, describe [place]   Alcohol Screening: Patient refused Alcohol Screening Tool: Yes 1. How often do you have a drink containing alcohol?: 2 to 4 times a month 2. How many drinks containing alcohol do you have on a typical day when you are drinking?: 1 or 2 3. How often do you have six or more drinks on one occasion?: Never AUDIT-C  Score: 2 Alcohol Brief Interventions/Follow-up: Alcohol education/Brief advice Substance Abuse  History in the last 12 months:  No. Consequences of Substance Abuse: Negative Previous Psychotropic Medications: Yes  Psychological Evaluations: Yes  Past Medical History:  Past Medical History:  Diagnosis Date   Acne    ADHD (attention deficit hyperactivity disorder)    Allergy    Asthma    Functional murmur    Migraines     Past Surgical History:  Procedure Laterality Date   NO PAST SURGERIES  2017   Family History:  Family History  Problem Relation Age of Onset   Hypertension Mother    Cancer Father        died at 40,unsure type, possibly colon   Hypertension Father    Stroke Maternal Grandmother 21   Heart disease Neg Hx    Family Psychiatric  History: see above Tobacco Screening:  Social History   Tobacco Use  Smoking Status Never  Smokeless Tobacco Never    BH Tobacco Counseling     Are you interested in Tobacco Cessation Medications?  No value filed. Counseled patient on smoking cessation:  No value filed. Reason Tobacco Screening Not Completed: No value filed.       Social History:  Social History   Substance and Sexual Activity  Alcohol Use No     Social History   Substance and Sexual Activity  Drug Use No    Additional Social History:                           Allergies:  No Known Allergies Lab Results:  Results for orders placed or performed during the hospital encounter of 06/30/23 (from the past 48 hour(s))  Hemoglobin A1c     Status: None   Collection Time: 06/30/23  6:15 AM  Result Value Ref Range   Hgb A1c MFr Bld 5.0 4.8 - 5.6 %    Comment: (NOTE) Pre diabetes:          5.7%-6.4%  Diabetes:              >6.4%  Glycemic control for   <7.0% adults with diabetes    Mean Plasma Glucose 96.8 mg/dL    Comment: Performed at [facility], [address], [place], [place] [address]  TSH     Status: None   Collection Time: 06/30/23  6:15 AM  Result Value Ref Range   TSH 2.769 0.350 - 4.500 uIU/mL    Comment:  Performed by a 3rd Generation assay with a functional sensitivity of <=0.01 uIU/mL. Performed at [facility], [address] [place], [place] [address]   POCT Urine Drug Screen - (I-Screen)     Status: Abnormal   Collection Time: 06/30/23  6:18 AM  Result Value Ref Range   POC Amphetamine UR Positive (A) NONE DETECTED (Cut Off Level 1000 ng/mL)   POC Secobarbital (BAR) None Detected NONE DETECTED (Cut Off Level 300 ng/mL)   POC Buprenorphine (BUP) None Detected NONE DETECTED (Cut Off Level 10 ng/mL)   POC Oxazepam (BZO) None Detected NONE DETECTED (Cut Off Level 300 ng/mL)   POC Cocaine UR None Detected NONE DETECTED (Cut Off Level 300 ng/mL)   POC Methamphetamine UR None Detected NONE DETECTED (Cut Off Level 1000 ng/mL)   POC Morphine None Detected NONE DETECTED (Cut Off Level 300 ng/mL)   POC Methadone UR None Detected NONE DETECTED (Cut Off Level 300 ng/mL)  POC Oxycodone UR None Detected NONE DETECTED (Cut Off Level 100 ng/mL)   POC Marijuana UR None Detected NONE DETECTED (Cut Off Level 50 ng/mL)    Blood Alcohol level:  Lab Results  Component Value Date   ETH <10 06/30/2023    Metabolic Disorder Labs:  Lab Results  Component Value Date   HGBA1C 5.0 06/30/2023   MPG 96.8 06/30/2023   No results found for: "PROLACTIN" Lab Results  Component Value Date   CHOL 133 02/09/2023   TRIG 37 02/09/2023   HDL 53 02/09/2023   CHOLHDL 2.5 02/09/2023   VLDL 9 05/16/2016   LDLCALC 71 02/09/2023   LDLCALC 70 05/16/2016    Current Medications: Current Facility-Administered Medications  Medication Dose Route Frequency Provider Last Rate Last Admin   acetaminophen (TYLENOL) tablet 650 mg  650 mg Oral Q6H PRN Ardis Hughs, NP       alum & mag hydroxide-simeth (MAALOX/MYLANTA) 200-200-20 MG/5ML suspension 15 mL  15 mL Oral Q6H PRN Ardis Hughs, NP       amphetamine-dextroamphetamine (ADDERALL XR) 24 hr capsule 25 mg  25 mg Oral Alton Revere, Daphine Deutscher, MD   25 mg  at 07/01/23 1009   haloperidol (HALDOL) tablet 5 mg  5 mg Oral TID PRN Ardis Hughs, NP       And   diphenhydrAMINE (BENADRYL) capsule 50 mg  50 mg Oral TID PRN Ardis Hughs, NP       haloperidol lactate (HALDOL) injection 5 mg  5 mg Intramuscular TID PRN Ardis Hughs, NP       And   diphenhydrAMINE (BENADRYL) injection 50 mg  50 mg Intramuscular TID PRN Ardis Hughs, NP       And   LORazepam (ATIVAN) injection 2 mg  2 mg Intramuscular TID PRN Ardis Hughs, NP       haloperidol lactate (HALDOL) injection 10 mg  10 mg Intramuscular TID PRN Ardis Hughs, NP       And   diphenhydrAMINE (BENADRYL) injection 50 mg  50 mg Intramuscular TID PRN Ardis Hughs, NP       And   LORazepam (ATIVAN) injection 2 mg  2 mg Intramuscular TID PRN Ardis Hughs, NP       hydrOXYzine (ATARAX) tablet 25 mg  25 mg Oral TID PRN Ardis Hughs, NP       sertraline (ZOLOFT) tablet 25 mg  25 mg Oral Daily Ardis Hughs, NP   25 mg at 07/01/23 9528   PTA Medications: Medications Prior to Admission  Medication Sig Dispense Refill Last Dose   amphetamine-dextroamphetamine (ADDERALL XR) 25 MG 24 hr capsule Take 1 capsule by mouth every morning. 30 capsule 0    Multiple Vitamins-Minerals (MULTIVITAMIN GUMMIES ADULTS PO) Take 1 tablet by mouth daily.      sertraline (ZOLOFT) 25 MG tablet Take 1 tablet (25 mg total) by mouth daily.       Musculoskeletal: Strength & Muscle Tone: within normal limits Gait & Station: normal Patient leans: N/A            Psychiatric Specialty Exam:  Presentation  General Appearance:  Other (comment) (in hospital scrubs)  Eye Contact: Poor  Speech: Clear and Coherent  Speech Volume: Normal  Handedness: Right   Mood and Affect  Mood: Anxious; Depressed; Irritable  Affect: Tearful   Thought Process  Thought Processes: Coherent  Duration of Psychotic Symptoms: DEPRESSIVE SYMPTOMS 1 WEEKS Past  Diagnosis of  Schizophrenia or Psychoactive disorder: No  Descriptions of Associations:Intact  Orientation:Full (Time, Place and Person)  Thought Content:WDL  Hallucinations:Hallucinations: Auditory Description of Auditory Hallucinations: Pt reports voices talking to him about his life  Ideas of Reference:None  Suicidal Thoughts:Suicidal Thoughts: Yes, Active SI Active Intent and/or Plan: With Plan; With Intent; With Means to Carry Out  Homicidal Thoughts:Homicidal Thoughts: No   Sensorium  Memory: Immediate Fair  Judgment: Poor  Insight: Poor   Executive Functions  Concentration: Fair  Attention Span: Fair  Recall: [employer] of Knowledge: Fair  Language: Fair   Psychomotor Activity  Psychomotor Activity: Psychomotor Activity: Normal   Assets  Assets: Manufacturing systems engineer; Desire for Improvement   Sleep  Sleep: Sleep: Poor    Physical Exam: Physical Exam Vitals and nursing note reviewed.  Constitutional:      Appearance: Normal appearance.  HENT:     Head: Normocephalic and atraumatic.     Nose: Nose normal.  Pulmonary:     Effort: Pulmonary effort is normal.  Musculoskeletal:        General: Normal range of motion.     Cervical back: Normal range of motion.  Neurological:     General: No focal deficit present.     Mental Status: He is alert and oriented to person, place, and time. Mental status is at baseline.  Psychiatric:        Attention and Perception: Attention normal. He perceives auditory hallucinations.        Mood and Affect: Mood is depressed. Affect is flat.        Speech: Speech is rapid and pressured.        Behavior: Behavior normal. Behavior is cooperative.        Thought Content: Thought content includes suicidal ideation.        Cognition and Memory: Cognition and memory normal.        Judgment: Judgment is impulsive.    Review of Systems  Psychiatric/Behavioral:  Positive for hallucinations and suicidal  ideas. The patient is nervous/anxious.   All other systems reviewed and are negative.  Blood pressure 124/78, pulse 71, temperature 97.7 F (36.5 C), temperature source Oral, resp. rate 19, height 5\' 7"  (1.702 m), weight 70.8 kg, SpO2 98%. Body mass index is 24.43 kg/m.  Treatment Plan Summary: Daily contact with patient to assess and evaluate symptoms and progress in treatment and Medication management Initiate Sertraline (Zoloft) 50 mg daily for depression. Initiate Risperidone 0.5mg  nightly to address auditory hallucinations and mood instability. Initiate Lamotrigine 25 mg daily, titrate as tolerated for mood stabilization. Discontinue Adderall XR 25 mg daily to reduce risk of exacerbating anxiety or psychosis. Prescribe Melatonin 5 mg nightly for sleep support. Psychoeducation on coping mechanisms for depressive symptoms and auditory hallucinations.  Observation Level/Precautions:  Continuous Observation 15 minute checks Seizure  Laboratory:   none noted  Psychotherapy:    Medications:  Sertraline  Risperidone Lamotrigine Melatonin   Consultations:    Discharge Concerns:    Estimated LOS: 3-5  Other:     Physician Treatment Plan for Primary Diagnosis: MDD (major depressive disorder), severe (HCC) Long Term Goal(s): Improvement in symptoms so as ready for discharge  Short Term Goals: Ability to identify changes in lifestyle to reduce recurrence of condition will improve, Ability to verbalize feelings will improve, Ability to disclose and discuss suicidal ideas, Ability to demonstrate self-control will improve, Ability to identify and develop effective coping behaviors will improve, Ability to maintain clinical measurements within normal limits will  improve, Compliance with prescribed medications will improve, and Ability to identify triggers associated with substance abuse/mental health issues will improve  Physician Treatment Plan for Secondary Diagnosis: Principal Problem:   MDD  (major depressive disorder), severe (HCC)  Long Term Goal(s): Improvement in symptoms so as ready for discharge  Short Term Goals: Ability to identify changes in lifestyle to reduce recurrence of condition will improve, Ability to verbalize feelings will improve, Ability to disclose and discuss suicidal ideas, Ability to demonstrate self-control will improve, Ability to identify and develop effective coping behaviors will improve, Ability to maintain clinical measurements within normal limits will improve, Compliance with prescribed medications will improve, and Ability to identify triggers associated with substance abuse/mental health issues will improve  I certify that inpatient services furnished can reasonably be expected to improve the patient's condition.    Myriam Forehand, NP 12/11/20241:31 PM

## 2023-07-02 DIAGNOSIS — F322 Major depressive disorder, single episode, severe without psychotic features: Secondary | ICD-10-CM | POA: Diagnosis not present

## 2023-07-02 NOTE — Group Note (Signed)
[facility] LCSW Group Therapy Note   Group Date: 07/02/2023 Start Time: 1300 End Time: 1400   Type of Therapy/Topic:  Group Therapy:  Balance in Life  Participation Level:  Minimal   Description of Group:    This group will address the concept of balance and how it feels and looks when one is unbalanced. Patients will be encouraged to process areas in their lives that are out of balance, and identify reasons for remaining unbalanced. Facilitators will guide patients utilizing problem- solving interventions to address and correct the stressor making their life unbalanced. Understanding and applying boundaries will be explored and addressed for obtaining  and maintaining a balanced life. Patients will be encouraged to explore ways to assertively make their unbalanced needs known to significant others in their lives, using other group members and facilitator for support and feedback.  Therapeutic Goals: Patient will identify two or more emotions or situations they have that consume much of in their lives. Patient will identify signs/triggers that life has become out of balance:  Patient will identify two ways to set boundaries in order to achieve balance in their lives:  Patient will demonstrate ability to communicate their needs through discussion and/or role plays  Summary of Patient Progress: Patient was present for the entirety of the group process. He shared that his girlfriend recently ended things with him and he does not understand why she stayed so long if she was unhappy in their relationship. At times, pt appeared to want to say something during the conversation but after others were done speaking he denied having anything to say. However, he did appear to attend to the conversation and his behavior was appropriate. Insight into the topic is questionable. However, pt did appear to be open and receptive to feedback/comments from both her peers and facilitator.    Therapeutic Modalities:    Cognitive Behavioral Therapy Solution-Focused Therapy Assertiveness Training   Glenis Smoker, LCSW

## 2023-07-02 NOTE — Group Note (Signed)
Date:  07/02/2023 Time:  11:26 AM  Group Topic/Focus:  Goals Group:   The focus of this group is to help patients establish daily goals to achieve during treatment and discuss how the patient can incorporate goal setting into their daily lives to aide in recovery.   Participation Level:  Did Not Attend   Kshawn Canal A Ajamu Maxon 07/02/2023, 11:26 AM

## 2023-07-02 NOTE — Group Note (Signed)
Date:  07/02/2023 Time:  8:58 PM  Group Topic/Focus:  Primary and Secondary Emotions:   The focus of this group is to discuss the difference between primary and secondary emotions.    Participation Level:  did not attend  Participation Quality:   none  Affect:   none  Cognitive:   none  Insight: None  Engagement in Group:   none  Modes of Intervention:   none  Additional Comments:  none   Ian [name] 07/02/2023, 8:58 PM

## 2023-07-02 NOTE — Progress Notes (Incomplete)
[facility] MD Progress Note  07/02/2023 5:57 PM Ian [name]  MRN:  [id number] Subjective:  *** Principal Problem: MDD (major depressive disorder), severe (HCC) Diagnosis: Principal Problem:   MDD (major depressive disorder), severe (HCC)  Total Time spent with patient: {Time; 15 min - 8 hours:17441}  Past Psychiatric History: ***  Past Medical History:  Past Medical History:  Diagnosis Date   Acne    ADHD (attention deficit hyperactivity disorder)    Allergy    Asthma    Functional murmur    Migraines     Past Surgical History:  Procedure Laterality Date   NO PAST SURGERIES  2017   Family History:  Family History  Problem Relation Age of Onset   Hypertension Mother    Cancer Father        died at 40,unsure type, possibly colon   Hypertension Father    Stroke Maternal Grandmother 37   Heart disease Neg Hx    Family Psychiatric  History: *** Social History:  Social History   Substance and Sexual Activity  Alcohol Use No     Social History   Substance and Sexual Activity  Drug Use No    Social History   Socioeconomic History   Marital status: Single    Spouse name: Not on file   Number of children: Not on file   Years of education: Not on file   Highest education level: Not on file  Occupational History   Not on file  Tobacco Use   Smoking status: Never   Smokeless tobacco: Never  Substance and Sexual Activity   Alcohol use: No   Drug use: No   Sexual activity: Not on file  Other Topics Concern   Not on file  Social History Narrative   Lives with mom and older brother. No pets or tobacco exposure (outside dog).  10th grade, [name]   Social Drivers of Corporate investment banker Strain: Not on file  Food Insecurity: Food Insecurity Present (06/30/2023)   Hunger Vital Sign    Worried About Running Out of Food in the Last Year: Never true    Ran Out of Food in the Last Year: Sometimes true  Transportation Needs: No Transportation Needs  (06/30/2023)   PRAPARE - Administrator, Civil Service (Medical): No    Lack of Transportation (Non-Medical): No  Physical Activity: Insufficiently Active (02/09/2023)   Exercise Vital Sign    Days of Exercise per Week: 3 days    Minutes of Exercise per Session: 30 min  Stress: Not on file  Social Connections: Unknown (12/03/2021)   Received from [facility]   Social Network    Social Network: Not on file   Additional Social History:                         Sleep: {BHH GOOD/FAIR/POOR:22877}  Appetite:  {BHH GOOD/FAIR/POOR:22877}  Current Medications: Current Facility-Administered Medications  Medication Dose Route Frequency Provider Last Rate Last Admin   acetaminophen (TYLENOL) tablet 650 mg  650 mg Oral Q6H PRN Ardis Hughs, NP       alum & mag hydroxide-simeth (MAALOX/MYLANTA) 200-200-20 MG/5ML suspension 15 mL  15 mL Oral Q6H PRN Ardis Hughs, NP       haloperidol (HALDOL) tablet 5 mg  5 mg Oral TID PRN Ardis Hughs, NP       And   diphenhydrAMINE (BENADRYL) capsule 50 mg  50  mg Oral TID PRN Ardis Hughs, NP       haloperidol lactate (HALDOL) injection 5 mg  5 mg Intramuscular TID PRN Ardis Hughs, NP       And   diphenhydrAMINE (BENADRYL) injection 50 mg  50 mg Intramuscular TID PRN Ardis Hughs, NP       And   LORazepam (ATIVAN) injection 2 mg  2 mg Intramuscular TID PRN Ardis Hughs, NP       haloperidol lactate (HALDOL) injection 10 mg  10 mg Intramuscular TID PRN Ardis Hughs, NP       And   diphenhydrAMINE (BENADRYL) injection 50 mg  50 mg Intramuscular TID PRN Ardis Hughs, NP       And   LORazepam (ATIVAN) injection 2 mg  2 mg Intramuscular TID PRN Ardis Hughs, NP       hydrOXYzine (ATARAX) tablet 25 mg  25 mg Oral TID PRN Ardis Hughs, NP       lamoTRIgine (LAMICTAL) tablet 25 mg  25 mg Oral Daily Myriam Forehand, NP   25 mg at 07/02/23 0930   melatonin tablet 5  mg  5 mg Oral QHS Myriam Forehand, NP   5 mg at 07/01/23 2121   risperiDONE (RISPERDAL) tablet 0.5 mg  0.5 mg Oral QHS Myriam Forehand, NP   0.5 mg at 07/01/23 2120   sertraline (ZOLOFT) tablet 50 mg  50 mg Oral Daily Myriam Forehand, NP   50 mg at 07/02/23 0930   traZODone (DESYREL) tablet 50 mg  50 mg Oral QHS PRN Myriam Forehand, NP        Lab Results: No results found for this or any previous visit (from the past 48 hours).  Blood Alcohol level:  Lab Results  Component Value Date   ETH <10 06/30/2023    Metabolic Disorder Labs: Lab Results  Component Value Date   HGBA1C 5.0 06/30/2023   MPG 96.8 06/30/2023   No results found for: "PROLACTIN" Lab Results  Component Value Date   CHOL 133 02/09/2023   TRIG 37 02/09/2023   HDL 53 02/09/2023   CHOLHDL 2.5 02/09/2023   VLDL 9 05/16/2016   LDLCALC 71 02/09/2023   LDLCALC 70 05/16/2016    Physical Findings: AIMS:  , ,  ,  ,    CIWA:    COWS:     Musculoskeletal: Strength & Muscle Tone: {desc; muscle tone:32375} Gait & Station: {PE GAIT ED NATL:22525} Patient leans: {Patient Leans:21022755}  Psychiatric Specialty Exam:  Presentation  General Appearance:  Fairly Groomed  Eye Contact: Minimal  Speech: Clear and Coherent; Pressured  Speech Volume: Normal  Handedness: Right   Mood and Affect  Mood: Anxious  Affect: Flat   Thought Process  Thought Processes: Coherent  Descriptions of Associations:Intact  Orientation:Full (Time, Place and Person)  Thought Content:WDL  History of Schizophrenia/Schizoaffective disorder:No  Duration of Psychotic Symptoms:No data recorded Hallucinations:Hallucinations: Auditory Description of Auditory Hallucinations: "they just telling me about my life and what to do"  Ideas of Reference:None  Suicidal Thoughts:Suicidal Thoughts: Yes, Passive SI Active Intent and/or Plan: Without Intent; Without Access to Means; Without Plan SI Passive Intent and/or Plan: Without  Intent; Without Access to Means; Without Means to Carry Out  Homicidal Thoughts:Homicidal Thoughts: No HI Passive Intent and/or Plan: -- (denies)   Sensorium  Memory: Immediate Fair; Remote Fair  Judgment: Poor  Insight: Poor   Executive Functions  Concentration: Fair  Attention Span: Fair  Recall: Fair  Fund of Knowledge: Good  Language: Good   Psychomotor Activity  Psychomotor Activity: Psychomotor Activity: Normal   Assets  Assets: Housing; Health and safety inspector; Communication Skills   Sleep  Sleep: Sleep: Good Number of Hours of Sleep: 6    Physical Exam: Physical Exam ROS Blood pressure 113/62, pulse 69, temperature 98.4 F (36.9 C), temperature source Oral, resp. rate 18, height 5\' 7"  (1.702 m), weight 70.8 kg, SpO2 100%. Body mass index is 24.43 kg/m.   Treatment Plan Summary: {CHL [facility] MD TX. ZOXW:960454098}  Myriam Forehand, NP 07/02/2023, 5:57 PM
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
## 2023-07-02 NOTE — Progress Notes (Signed)
   07/02/23 1105  Psych Admission Type (Psych Patients Only)  Admission Status Voluntary  Psychosocial Assessment  Patient Complaints Depression  Eye Contact Brief  Facial Expression Flat  Affect Flat  Speech Logical/coherent  Interaction Assertive  Motor Activity Slow  Appearance/Hygiene In scrubs  Behavior Characteristics Cooperative  Mood Depressed  Thought Process  Coherency Blocking  Content Preoccupation  Delusions None reported or observed  Perception WDL  Hallucination None reported or observed  Judgment Impaired  Confusion None  Danger to Self  Current suicidal ideation? Denies  Agreement Not to Harm Self Yes  Description of Agreement verbal  Danger to Others  Danger to Others None reported or observed

## 2023-07-02 NOTE — B[facility] Suicide Risk Assessment (Signed)
B[facility] INPATIENT:  Family/Significant Other Suicide Prevention Education  Suicide Prevention Education:  Education Completed; Lynwood Dawley, aunt, [phone] has been identified by the patient as the family member/significant other with whom the patient will be residing, and identified as the person(s) who will aid the patient in the event of a mental health crisis (suicidal ideations/suicide attempt).  With written consent from the patient, the family member/significant other has been provided the following suicide prevention education, prior to the and/or following the discharge of the patient.  The suicide prevention education provided includes the following: Suicide risk factors Suicide prevention and interventions National Suicide Hotline telephone number [facility] assessment telephone number [facility] Emergency Assistance 911 [facility] and/or Residential Mobile Crisis Unit telephone number  Request made of family/significant other to: Remove weapons (e.g., guns, rifles, knives), all items previously/currently identified as safety concern.   Remove drugs/medications (over-the-counter, prescriptions, illicit drugs), all items previously/currently identified as a safety concern.  The family member/significant other verbalizes understanding of the suicide prevention education information provided.  The family member/significant other agrees to remove the items of safety concern listed above.  Ian [name] 07/02/2023, 10:14 AM

## 2023-07-02 NOTE — Plan of Care (Signed)
 Pt denies SI/HI/AVH, compliant with procedures on the unit  Problem: Education: Goal: Knowledge of [facility] Education information/materials will improve Outcome: Progressing Goal: Emotional status will improve Outcome: Progressing Goal: Mental status will improve Outcome: Progressing Goal: Verbalization of understanding the information provided will improve Outcome: Progressing   Problem: Activity: Goal: Interest or engagement in activities will improve Outcome: Progressing Goal: Sleeping patterns will improve Outcome: Progressing   Problem: Coping: Goal: Ability to verbalize frustrations and anger appropriately will improve Outcome: Progressing Goal: Ability to demonstrate self-control will improve Outcome: Progressing   Problem: Health Behavior/Discharge Planning: Goal: Identification of resources available to assist in meeting health care needs will improve Outcome: Progressing Goal: Compliance with treatment plan for underlying cause of condition will improve Outcome: Progressing   Problem: Physical Regulation: Goal: Ability to maintain clinical measurements within normal limits will improve Outcome: Progressing   Problem: Safety: Goal: Periods of time without injury will increase Outcome: Progressing   Problem: Education: Goal: Utilization of techniques to improve thought processes will improve Outcome: Progressing Goal: Knowledge of the prescribed therapeutic regimen will improve Outcome: Progressing   Problem: Activity: Goal: Interest or engagement in leisure activities will improve Outcome: Progressing Goal: Imbalance in normal sleep/wake cycle will improve Outcome: Progressing   Problem: Coping: Goal: Coping ability will improve Outcome: Progressing Goal: Will verbalize feelings Outcome: Progressing   Problem: Health Behavior/Discharge Planning: Goal: Ability to make decisions will improve Outcome: Progressing Goal: Compliance with  therapeutic regimen will improve Outcome: Progressing   Problem: Role Relationship: Goal: Will demonstrate positive changes in social behaviors and relationships Outcome: Progressing   Problem: Safety: Goal: Ability to disclose and discuss suicidal ideas will improve Outcome: Progressing Goal: Ability to identify and utilize support systems that promote safety will improve Outcome: Progressing   Problem: Self-Concept: Goal: Will verbalize positive feelings about self Outcome: Progressing Goal: Level of anxiety will decrease Outcome: Progressing

## 2023-07-02 NOTE — Plan of Care (Addendum)
Patient complaint with policy. Denies SI,HI,AVH and pain.  Problem: Education: Goal: Knowledge of [facility] Education information/materials will improve Outcome: Progressing Goal: Emotional status will improve Outcome: Progressing Goal: Mental status will improve Outcome: Progressing Goal: Verbalization of understanding the information provided will improve Outcome: Progressing   Problem: Activity: Goal: Interest or engagement in activities will improve Outcome: Progressing Goal: Sleeping patterns will improve Outcome: Progressing   Problem: Coping: Goal: Ability to verbalize frustrations and anger appropriately will improve Outcome: Progressing

## 2023-07-03 DIAGNOSIS — F322 Major depressive disorder, single episode, severe without psychotic features: Secondary | ICD-10-CM | POA: Diagnosis not present

## 2023-07-03 NOTE — Group Note (Signed)
Date:  07/03/2023 Time:  5:44 PM  Group Topic/Focus:  Wellness Toolbox:   The focus of this group is to discuss various aspects of wellness, balancing those aspects and exploring ways to increase the ability to experience wellness.  Patients will create a wellness toolbox for use upon discharge.    Participation Level:  Active  Participation Quality:  Appropriate  Affect:  Appropriate  Cognitive:  Appropriate  Insight: Appropriate  Engagement in Group:  Engaged  Modes of Intervention:  Activity  Additional Comments:    Wilford Corner 07/03/2023, 5:44 PM

## 2023-07-03 NOTE — Group Note (Signed)
Date:  07/03/2023 Time:  11:03 AM  Group Topic/Focus:  Goals Group:   The focus of this group is to help patients establish daily goals to achieve during treatment and discuss how the patient can incorporate goal setting into their daily lives to aide in recovery. Wellness Toolbox:   The focus of this group is to discuss various aspects of wellness, balancing those aspects and exploring ways to increase the ability to experience wellness.  Patients will create a wellness toolbox for use upon discharge.    Participation Level:  Active  Participation Quality:  Appropriate  Affect:  Appropriate  Cognitive:  Appropriate  Insight: Appropriate  Engagement in Group:  Engaged  Modes of Intervention:  Discussion, Education, and Support  Additional Comments:    Wilford Corner 07/03/2023, 11:03 AM

## 2023-07-03 NOTE — Plan of Care (Signed)

## 2023-07-03 NOTE — Group Note (Signed)
Date:  07/03/2023 Time:  8:53 PM  Group Topic/Focus:  Wrap-Up Group:   The focus of this group is to help patients review their daily goal of treatment and discuss progress on daily workbooks.    Participation Level:  Active  Participation Quality:  Appropriate and Attentive  Affect:  Appropriate  Cognitive:  Alert and Appropriate  Insight: Appropriate  Engagement in Group:  Limited  Modes of Intervention:  Discussion  Additional Comments:     [name],Kellsie Grindle E 07/03/2023, 8:53 PM

## 2023-07-03 NOTE — Progress Notes (Signed)
[facility] MD Progress Note  07/03/2023 7:52 PM DELTA ISING  MRN:  [id number] Subjective:  *** Principal Problem: MDD (major depressive disorder), severe (HCC) Diagnosis: Principal Problem:   MDD (major depressive disorder), severe (HCC)  Total Time spent with patient: {Time; 15 min - 8 hours:17441}  Past Psychiatric History: ***  Past Medical History:  Past Medical History:  Diagnosis Date   Acne    ADHD (attention deficit hyperactivity disorder)    Allergy    Asthma    Functional murmur    Migraines     Past Surgical History:  Procedure Laterality Date   NO PAST SURGERIES  2017   Family History:  Family History  Problem Relation Age of Onset   Hypertension Mother    Cancer Father        died at 40,unsure type, possibly colon   Hypertension Father    Stroke Maternal Grandmother 63   Heart disease Neg Hx    Family Psychiatric  History: *** Social History:  Social History   Substance and Sexual Activity  Alcohol Use No     Social History   Substance and Sexual Activity  Drug Use No    Social History   Socioeconomic History   Marital status: Single    Spouse name: Not on file   Number of children: Not on file   Years of education: Not on file   Highest education level: Not on file  Occupational History   Not on file  Tobacco Use   Smoking status: Never   Smokeless tobacco: Never  Substance and Sexual Activity   Alcohol use: No   Drug use: No   Sexual activity: Not on file  Other Topics Concern   Not on file  Social History Narrative   Lives with mom and older brother. No pets or tobacco exposure (outside dog).  10th grade, [name]   Social Drivers of Corporate investment banker Strain: Not on file  Food Insecurity: Food Insecurity Present (06/30/2023)   Hunger Vital Sign    Worried About Running Out of Food in the Last Year: Never true    Ran Out of Food in the Last Year: Sometimes true  Transportation Needs: No Transportation Needs  (06/30/2023)   PRAPARE - Administrator, Civil Service (Medical): No    Lack of Transportation (Non-Medical): No  Physical Activity: Insufficiently Active (02/09/2023)   Exercise Vital Sign    Days of Exercise per Week: 3 days    Minutes of Exercise per Session: 30 min  Stress: Not on file  Social Connections: Unknown (12/03/2021)   Received from [facility], [facility]   Social Network    Social Network: Not on file   Additional Social History:                         Sleep: {BHH GOOD/FAIR/POOR:22877}  Appetite:  {BHH GOOD/FAIR/POOR:22877}  Current Medications: Current Facility-Administered Medications  Medication Dose Route Frequency Provider Last Rate Last Admin   acetaminophen (TYLENOL) tablet 650 mg  650 mg Oral Q6H PRN Ardis Hughs, NP       alum & mag hydroxide-simeth (MAALOX/MYLANTA) 200-200-20 MG/5ML suspension 15 mL  15 mL Oral Q6H PRN Ardis Hughs, NP       haloperidol (HALDOL) tablet 5 mg  5 mg Oral TID PRN Ardis Hughs, NP       And   diphenhydrAMINE (BENADRYL) capsule 50 mg  50  mg Oral TID PRN Ardis Hughs, NP       haloperidol lactate (HALDOL) injection 5 mg  5 mg Intramuscular TID PRN Ardis Hughs, NP       And   diphenhydrAMINE (BENADRYL) injection 50 mg  50 mg Intramuscular TID PRN Ardis Hughs, NP       And   LORazepam (ATIVAN) injection 2 mg  2 mg Intramuscular TID PRN Ardis Hughs, NP       haloperidol lactate (HALDOL) injection 10 mg  10 mg Intramuscular TID PRN Ardis Hughs, NP       And   diphenhydrAMINE (BENADRYL) injection 50 mg  50 mg Intramuscular TID PRN Ardis Hughs, NP       And   LORazepam (ATIVAN) injection 2 mg  2 mg Intramuscular TID PRN Ardis Hughs, NP       hydrOXYzine (ATARAX) tablet 25 mg  25 mg Oral TID PRN Ardis Hughs, NP       lamoTRIgine (LAMICTAL) tablet 25 mg  25 mg Oral Daily Myriam Forehand, NP   25 mg at 07/03/23 0856   melatonin tablet 5  mg  5 mg Oral QHS Myriam Forehand, NP   5 mg at 07/02/23 2112   risperiDONE (RISPERDAL) tablet 0.5 mg  0.5 mg Oral QHS Myriam Forehand, NP   0.5 mg at 07/02/23 2111   sertraline (ZOLOFT) tablet 50 mg  50 mg Oral Daily Myriam Forehand, NP   50 mg at 07/03/23 0856   traZODone (DESYREL) tablet 50 mg  50 mg Oral QHS PRN Myriam Forehand, NP        Lab Results: No results found for this or any previous visit (from the past 48 hours).  Blood Alcohol level:  Lab Results  Component Value Date   ETH <10 06/30/2023    Metabolic Disorder Labs: Lab Results  Component Value Date   HGBA1C 5.0 06/30/2023   MPG 96.8 06/30/2023   No results found for: "PROLACTIN" Lab Results  Component Value Date   CHOL 133 02/09/2023   TRIG 37 02/09/2023   HDL 53 02/09/2023   CHOLHDL 2.5 02/09/2023   VLDL 9 05/16/2016   LDLCALC 71 02/09/2023   LDLCALC 70 05/16/2016    Physical Findings: AIMS:  , ,  ,  ,    CIWA:    COWS:     Musculoskeletal: Strength & Muscle Tone: {desc; muscle tone:32375} Gait & Station: {PE GAIT ED NATL:22525} Patient leans: {Patient Leans:21022755}  Psychiatric Specialty Exam:  Presentation  General Appearance:  Fairly Groomed  Eye Contact: Minimal  Speech: Clear and Coherent; Pressured  Speech Volume: Normal  Handedness: Right   Mood and Affect  Mood: Anxious  Affect: Flat   Thought Process  Thought Processes: Coherent  Descriptions of Associations:Intact  Orientation:Full (Time, Place and Person)  Thought Content:WDL  History of Schizophrenia/Schizoaffective disorder:No  Duration of Psychotic Symptoms:No data recorded Hallucinations:No data recorded Ideas of Reference:None  Suicidal Thoughts:No data recorded Homicidal Thoughts:No data recorded  Sensorium  Memory: Immediate Fair; Remote Fair  Judgment: Poor  Insight: Poor   Art therapist  Concentration: Fair  Attention Span: Fair  Recall: Fair  Fund of  Knowledge: Good  Language: Good   Psychomotor Activity  Psychomotor Activity:No data recorded  Assets  Assets: Housing; Health and safety inspector; Communication Skills   Sleep  Sleep:No data recorded   Physical Exam: Physical Exam ROS Blood pressure (!) 142/70, pulse 86, temperature 97.7  F (36.5 C), temperature source Oral, resp. rate 20, height 5\' 7"  (1.702 m), weight 70.8 kg, SpO2 100%. Body mass index is 24.43 kg/m.   Treatment Plan Summary: {CHL [facility] MD [place]. JYNW:295621308}  Myriam Forehand, NP 07/03/2023, 7:52 PM

## 2023-07-03 NOTE — Progress Notes (Signed)
   07/03/23 1105  Psych Admission Type (Psych Patients Only)  Admission Status Voluntary  Psychosocial Assessment  Patient Complaints Worrying  Eye Contact Brief  Facial Expression Worried  Affect Flat  Speech Logical/coherent  Interaction Minimal  Motor Activity Slow  Appearance/Hygiene In scrubs  Behavior Characteristics Cooperative;Calm  Mood Anxious  Thought Process  Coherency Circumstantial  Content WDL  Delusions None reported or observed  Perception WDL  Hallucination None reported or observed  Judgment Impaired  Confusion None  Danger to Self  Current suicidal ideation? Denies  Agreement Not to Harm Self Yes  Description of Agreement verbal  Danger to Others  Danger to Others None reported or observed

## 2023-07-04 DIAGNOSIS — F322 Major depressive disorder, single episode, severe without psychotic features: Secondary | ICD-10-CM | POA: Diagnosis not present

## 2023-07-04 MED ORDER — RISPERIDONE 1 MG PO TABS
1.0000 mg | ORAL_TABLET | Freq: Every day | ORAL | Status: DC
  Administered 2023-07-04 – 2023-07-06 (×3): 1 mg via ORAL
  Filled 2023-07-04 (×3): qty 1

## 2023-07-04 NOTE — Progress Notes (Signed)
Patient complaint with policy. Denies SI,HI,AVH and pain.      07/03/23 2302  Psych Admission Type (Psych Patients Only)  Admission Status Voluntary  Psychosocial Assessment  Patient Complaints Substance abuse  Eye Contact Brief  Facial Expression Flat  Affect Flat  Speech Logical/coherent  Interaction Assertive  Motor Activity Slow  Appearance/Hygiene In scrubs  Behavior Characteristics Cooperative  Mood Anxious  Thought Process  Coherency Circumstantial  Content WDL  Delusions None reported or observed  Perception WDL  Hallucination None reported or observed  Judgment Impaired  Confusion None  Danger to Self  Current suicidal ideation? Denies  Agreement Not to Harm Self Yes  Description of Agreement verbal  Danger to Others  Danger to Others None reported or observed

## 2023-07-04 NOTE — Progress Notes (Signed)
[facility] MD Progress Note  07/04/2023 1:50 PM ROARY CURB  MRN:  [id number] Subjective:  22yo African American male I need more therapy and I just can't stop thinking about death." The patient expresses persistent preoccupation with thoughts of death but denies having a specific plan or intent to harm themselves. The patient acknowledges a desire to engage in more therapy to address these thoughts and feels overwhelmed by their emotional state. Principal Problem: MDD (major depressive disorder), severe (HCC) Diagnosis: Principal Problem:   MDD (major depressive disorder), severe (HCC)  Total Time spent with patient: 1 hour  Past Psychiatric History: ADHD  Past Medical History:  Past Medical History:  Diagnosis Date   Acne    ADHD (attention deficit hyperactivity disorder)    Allergy    Asthma    Functional murmur    Migraines     Past Surgical History:  Procedure Laterality Date   NO PAST SURGERIES  2017   Family History:  Family History  Problem Relation Age of Onset   Hypertension Mother    Cancer Father        died at 40,unsure type, possibly colon   Hypertension Father    Stroke Maternal Grandmother 69   Heart disease Neg Hx    Family Psychiatric  History: none reported Social History:  Social History   Substance and Sexual Activity  Alcohol Use No     Social History   Substance and Sexual Activity  Drug Use No    Social History   Socioeconomic History   Marital status: Single    Spouse name: Not on file   Number of children: Not on file   Years of education: Not on file   Highest education level: Not on file  Occupational History   Not on file  Tobacco Use   Smoking status: Never   Smokeless tobacco: Never  Substance and Sexual Activity   Alcohol use: No   Drug use: No   Sexual activity: Not on file  Other Topics Concern   Not on file  Social History Narrative   Lives with mom and older brother. No pets or tobacco exposure (outside dog).   10th grade, [name]   Social Drivers of Corporate investment banker Strain: Not on file  Food Insecurity: Food Insecurity Present (06/30/2023)   Hunger Vital Sign    Worried About Running Out of Food in the Last Year: Never true    Ran Out of Food in the Last Year: Sometimes true  Transportation Needs: No Transportation Needs (06/30/2023)   PRAPARE - Administrator, Civil Service (Medical): No    Lack of Transportation (Non-Medical): No  Physical Activity: Insufficiently Active (02/09/2023)   Exercise Vital Sign    Days of Exercise per Week: 3 days    Minutes of Exercise per Session: 30 min  Stress: Not on file  Social Connections: Unknown (12/03/2021)   Received from [facility], [facility]   Social Network    Social Network: Not on file   Additional Social History:                         Sleep: Good  Appetite:  Good  Current Medications: Current Facility-Administered Medications  Medication Dose Route Frequency Provider Last Rate Last Admin   acetaminophen (TYLENOL) tablet 650 mg  650 mg Oral Q6H PRN Ardis Hughs, NP       alum & mag hydroxide-simeth (MAALOX/MYLANTA) 200-200-20  MG/5ML suspension 15 mL  15 mL Oral Q6H PRN Ardis Hughs, NP       haloperidol (HALDOL) tablet 5 mg  5 mg Oral TID PRN Ardis Hughs, NP       And   diphenhydrAMINE (BENADRYL) capsule 50 mg  50 mg Oral TID PRN Ardis Hughs, NP       haloperidol lactate (HALDOL) injection 5 mg  5 mg Intramuscular TID PRN Ardis Hughs, NP       And   diphenhydrAMINE (BENADRYL) injection 50 mg  50 mg Intramuscular TID PRN Ardis Hughs, NP       And   LORazepam (ATIVAN) injection 2 mg  2 mg Intramuscular TID PRN Ardis Hughs, NP       haloperidol lactate (HALDOL) injection 10 mg  10 mg Intramuscular TID PRN Ardis Hughs, NP       And   diphenhydrAMINE (BENADRYL) injection 50 mg  50 mg Intramuscular TID PRN Ardis Hughs, NP       And    LORazepam (ATIVAN) injection 2 mg  2 mg Intramuscular TID PRN Ardis Hughs, NP       hydrOXYzine (ATARAX) tablet 25 mg  25 mg Oral TID PRN Ardis Hughs, NP       lamoTRIgine (LAMICTAL) tablet 25 mg  25 mg Oral Daily Myriam Forehand, NP   25 mg at 07/04/23 0858   melatonin tablet 5 mg  5 mg Oral QHS Myriam Forehand, NP   5 mg at 07/03/23 2115   risperiDONE (RISPERDAL) tablet 1 mg  1 mg Oral QHS Myriam Forehand, NP       sertraline (ZOLOFT) tablet 50 mg  50 mg Oral Daily Myriam Forehand, NP   50 mg at 07/04/23 0858   traZODone (DESYREL) tablet 50 mg  50 mg Oral QHS PRN Myriam Forehand, NP        Lab Results: No results found for this or any previous visit (from the past 48 hours).  Blood Alcohol level:  Lab Results  Component Value Date   ETH <10 06/30/2023    Metabolic Disorder Labs: Lab Results  Component Value Date   HGBA1C 5.0 06/30/2023   MPG 96.8 06/30/2023   No results found for: "PROLACTIN" Lab Results  Component Value Date   CHOL 133 02/09/2023   TRIG 37 02/09/2023   HDL 53 02/09/2023   CHOLHDL 2.5 02/09/2023   VLDL 9 05/16/2016   LDLCALC 71 02/09/2023   LDLCALC 70 05/16/2016    Physical Findings: AIMS:  , ,  ,  ,    CIWA:    COWS:     Musculoskeletal: Strength & Muscle Tone: within normal limits Gait & Station: normal Patient leans: N/A  Psychiatric Specialty Exam:  Presentation  General Appearance:  Appropriate for Environment; Neat  Eye Contact: Good  Speech: Clear and Coherent; Pressured  Speech Volume: Decreased  Handedness: Right   Mood and Affect  Mood: Irritable; Anxious  Affect: Flat   Thought Process  Thought Processes: Coherent  Descriptions of Associations:Intact  Orientation:Full (Time, Place and Person)  Thought Content:WDL  History of Schizophrenia/Schizoaffective disorder:No  Duration of Psychotic Symptoms: none reported Hallucinations:Hallucinations: None Description of Auditory Hallucinations:  denies  Ideas of Reference:None  Suicidal Thoughts:Suicidal Thoughts: Yes, Passive SI Active Intent and/or Plan: Without Intent; Without Means to Carry Out; Without Plan; Without Access to Means SI Passive Intent and/or Plan: Without Intent; Without Access  to Means; Without Means to Carry Out; Without Plan  Homicidal Thoughts:Homicidal Thoughts: No HI Passive Intent and/or Plan: -- (denies)   Sensorium  Memory: Immediate Good; Remote Good  Judgment: Fair  Insight: Fair   Art therapist  Concentration: Fair  Attention Span: Fair  Recall: Good  Fund of Knowledge: Good  Language: Good   Psychomotor Activity  Psychomotor Activity: Psychomotor Activity: Normal   Assets  Assets: Communication Skills; Housing   Sleep  Sleep: Sleep: Good Number of Hours of Sleep: 6    Physical Exam: Physical Exam Vitals and nursing note reviewed.  Constitutional:      Appearance: Normal appearance.  HENT:     Head: Normocephalic and atraumatic.     Nose: Nose normal.  Pulmonary:     Effort: Pulmonary effort is normal.  Musculoskeletal:        General: Normal range of motion.  Neurological:     General: No focal deficit present.     Mental Status: He is alert and oriented to person, place, and time. Mental status is at baseline.  Psychiatric:        Attention and Perception: Attention and perception normal.        Mood and Affect: Mood is anxious. Affect is flat.        Speech: Speech normal.        Behavior: Behavior normal. Behavior is cooperative.        Thought Content: Thought content includes suicidal ideation.        Cognition and Memory: Cognition and memory normal.        Judgment: Judgment is impulsive.    Review of Systems  Psychiatric/Behavioral:  Positive for suicidal ideas.   All other systems reviewed and are negative.  Blood pressure 122/76, pulse 64, temperature 98.3 F (36.8 C), resp. rate (!) 22, height 5\' 7"  (1.702 m), weight 70.8  kg, SpO2 98%. Body mass index is 24.43 kg/m.   Treatment Plan Summary: Initiate Sertraline (Zoloft) 50 mg daily for depression. Initiate Risperidone 0.5mg  nightly to address auditory hallucinations and mood instability. Initiate Lamotrigine 25 mg daily, titrate as tolerated for mood stabilization. Discontinue Adderall XR 25 mg daily to reduce risk of exacerbating anxiety or psychosis. Prescribe Melatonin 5 mg nightly for sleep support. Psychoeducation on coping mechanisms for depressive symptoms and auditory hallucinations.   Myriam Forehand, NP 07/04/2023, 1:50 PM

## 2023-07-04 NOTE — Plan of Care (Signed)
  Problem: Education: Goal: Knowledge of [facility] Education information/materials will improve Outcome: Progressing Goal: Emotional status will improve Outcome: Progressing Goal: Mental status will improve Outcome: Progressing Goal: Verbalization of understanding the information provided will improve Outcome: Progressing   Problem: Activity: Goal: Interest or engagement in activities will improve Outcome: Progressing Goal: Sleeping patterns will improve Outcome: Progressing   Problem: Coping: Goal: Ability to verbalize frustrations and anger appropriately will improve Outcome: Progressing Goal: Ability to demonstrate self-control will improve Outcome: Progressing   Problem: Health Behavior/Discharge Planning: Goal: Identification of resources available to assist in meeting health care needs will improve Outcome: Progressing Goal: Compliance with treatment plan for underlying cause of condition will improve Outcome: Progressing   Problem: Physical Regulation: Goal: Ability to maintain clinical measurements within normal limits will improve Outcome: Progressing   Problem: Safety: Goal: Periods of time without injury will increase Outcome: Progressing   Problem: Education: Goal: Utilization of techniques to improve thought processes will improve Outcome: Progressing Goal: Knowledge of the prescribed therapeutic regimen will improve Outcome: Progressing   

## 2023-07-04 NOTE — Group Note (Signed)
Date:  07/04/2023 Time:  10:08 PM  Group Topic/Focus:  Wrap-Up Group:   The focus of this group is to help patients review their daily goal of treatment and discuss progress on daily workbooks.    Participation Level:  Minimal  Participation Quality:  Appropriate and Attentive  Affect:  Appropriate and Flat  Cognitive:  Alert and Appropriate  Insight: Appropriate  Engagement in Group:  Engaged  Modes of Intervention:  Discussion  Additional Comments:     [name],Elleni Mozingo E 07/04/2023, 10:08 PM

## 2023-07-04 NOTE — Progress Notes (Signed)
[facility] MD Progress Note  07/04/2023 1:33 PM Ian [name]  MRN:  [id number] Subjective:  22 year old African American male, states, "I am not ready to go home yet; I still have thoughts." He reports ongoing suicidal thoughts but denies having a specific plan or intent. He expresses concern about his ability to manage his mental health if discharged at this time. The patient denies auditory or visual hallucinations (AVH). Principal Problem: MDD (major depressive disorder), severe (HCC) Diagnosis: Principal Problem:   MDD (major depressive disorder), severe (HCC)  Total Time spent with patient: 1 hour  Past Psychiatric History: ADHD  Past Medical History:  Past Medical History:  Diagnosis Date   Acne    ADHD (attention deficit hyperactivity disorder)    Allergy    Asthma    Functional murmur    Migraines     Past Surgical History:  Procedure Laterality Date   NO PAST SURGERIES  2017   Family History:  Family History  Problem Relation Age of Onset   Hypertension Mother    Cancer Father        died at 40,unsure type, possibly colon   Hypertension Father    Stroke Maternal Grandmother 25   Heart disease Neg Hx    Family Psychiatric  History: none reported Social History:  Social History   Substance and Sexual Activity  Alcohol Use No     Social History   Substance and Sexual Activity  Drug Use No    Social History   Socioeconomic History   Marital status: Single    Spouse name: Not on file   Number of children: Not on file   Years of education: Not on file   Highest education level: Not on file  Occupational History   Not on file  Tobacco Use   Smoking status: Never   Smokeless tobacco: Never  Substance and Sexual Activity   Alcohol use: No   Drug use: No   Sexual activity: Not on file  Other Topics Concern   Not on file  Social History Narrative   Lives with mom and older brother. No pets or tobacco exposure (outside dog).  10th grade, [name]    Social Drivers of Corporate investment banker Strain: Not on file  Food Insecurity: Food Insecurity Present (06/30/2023)   Hunger Vital Sign    Worried About Running Out of Food in the Last Year: Never true    Ran Out of Food in the Last Year: Sometimes true  Transportation Needs: No Transportation Needs (06/30/2023)   PRAPARE - Administrator, Civil Service (Medical): No    Lack of Transportation (Non-Medical): No  Physical Activity: Insufficiently Active (02/09/2023)   Exercise Vital Sign    Days of Exercise per Week: 3 days    Minutes of Exercise per Session: 30 min  Stress: Not on file  Social Connections: Unknown (12/03/2021)   Received from [facility], [facility]   Social Network    Social Network: Not on file   Additional Social History:                         Sleep: Good  Appetite:  Good  Current Medications: Current Facility-Administered Medications  Medication Dose Route Frequency Provider Last Rate Last Admin   acetaminophen (TYLENOL) tablet 650 mg  650 mg Oral Q6H PRN Ardis Hughs, NP       alum & mag hydroxide-simeth (MAALOX/MYLANTA) 200-200-20 MG/5ML suspension 15  mL  15 mL Oral Q6H PRN Ardis Hughs, NP       haloperidol (HALDOL) tablet 5 mg  5 mg Oral TID PRN Ardis Hughs, NP       And   diphenhydrAMINE (BENADRYL) capsule 50 mg  50 mg Oral TID PRN Ardis Hughs, NP       haloperidol lactate (HALDOL) injection 5 mg  5 mg Intramuscular TID PRN Ardis Hughs, NP       And   diphenhydrAMINE (BENADRYL) injection 50 mg  50 mg Intramuscular TID PRN Ardis Hughs, NP       And   LORazepam (ATIVAN) injection 2 mg  2 mg Intramuscular TID PRN Ardis Hughs, NP       haloperidol lactate (HALDOL) injection 10 mg  10 mg Intramuscular TID PRN Ardis Hughs, NP       And   diphenhydrAMINE (BENADRYL) injection 50 mg  50 mg Intramuscular TID PRN Ardis Hughs, NP       And   LORazepam (ATIVAN)  injection 2 mg  2 mg Intramuscular TID PRN Ardis Hughs, NP       hydrOXYzine (ATARAX) tablet 25 mg  25 mg Oral TID PRN Ardis Hughs, NP       lamoTRIgine (LAMICTAL) tablet 25 mg  25 mg Oral Daily Myriam Forehand, NP   25 mg at 07/04/23 0858   melatonin tablet 5 mg  5 mg Oral QHS Myriam Forehand, NP   5 mg at 07/03/23 2115   risperiDONE (RISPERDAL) tablet 1 mg  1 mg Oral QHS Myriam Forehand, NP       sertraline (ZOLOFT) tablet 50 mg  50 mg Oral Daily Myriam Forehand, NP   50 mg at 07/04/23 0858   traZODone (DESYREL) tablet 50 mg  50 mg Oral QHS PRN Myriam Forehand, NP        Lab Results: No results found for this or any previous visit (from the past 48 hours).  Blood Alcohol level:  Lab Results  Component Value Date   ETH <10 06/30/2023    Metabolic Disorder Labs: Lab Results  Component Value Date   HGBA1C 5.0 06/30/2023   MPG 96.8 06/30/2023   No results found for: "PROLACTIN" Lab Results  Component Value Date   CHOL 133 02/09/2023   TRIG 37 02/09/2023   HDL 53 02/09/2023   CHOLHDL 2.5 02/09/2023   VLDL 9 05/16/2016   LDLCALC 71 02/09/2023   LDLCALC 70 05/16/2016    Physical Findings: AIMS:  , ,  ,  ,    CIWA:    COWS:     Musculoskeletal: Strength & Muscle Tone: within normal limits Gait & Station: normal Patient leans: N/A  Psychiatric Specialty Exam:  Presentation  General Appearance:  Appropriate for Environment; Neat  Eye Contact: Good  Speech: Clear and Coherent; Pressured  Speech Volume: Decreased  Handedness: Right   Mood and Affect  Mood: Irritable; Anxious  Affect: Flat   Thought Process  Thought Processes: Coherent  Descriptions of Associations:Intact  Orientation:Full (Time, Place and Person)  Thought Content:WDL  History of Schizophrenia/Schizoaffective disorder:No  Duration of Psychotic Symptoms:none Hallucinations:Hallucinations: None Description of Auditory Hallucinations: denies  Ideas of  Reference:None  Suicidal Thoughts:Suicidal Thoughts: Yes, Passive SI Active Intent and/or Plan: Without Intent; Without Means to Carry Out; Without Plan; Without Access to Means SI Passive Intent and/or Plan: Without Intent; Without Access to Means; Without Means to  Carry Out; Without Plan  Homicidal Thoughts:Homicidal Thoughts: No HI Passive Intent and/or Plan: -- (denies)   Sensorium  Memory: Immediate Good; Remote Good  Judgment: Fair  Insight: Fair   Chartered certified accountant: Fair  Attention Span: Fair  Recall: Good  Fund of Knowledge: Good  Language: Good   Psychomotor Activity  Psychomotor Activity: Psychomotor Activity: Normal   Assets  Assets: Communication Skills; Housing   Sleep  Sleep: Sleep: Good Number of Hours of Sleep: 6    Physical Exam: Physical Exam Vitals and nursing note reviewed.  Constitutional:      Appearance: Normal appearance.  HENT:     Head: Normocephalic and atraumatic.     Nose: Nose normal.  Pulmonary:     Effort: Pulmonary effort is normal.  Musculoskeletal:        General: Normal range of motion.     Cervical back: Normal range of motion.  Neurological:     Mental Status: He is alert.  Psychiatric:        Attention and Perception: Attention and perception normal.        Mood and Affect: Mood is anxious. Affect is flat.        Speech: Speech is rapid and pressured.        Behavior: Behavior normal. Behavior is cooperative.        Thought Content: Thought content includes suicidal ideation.        Cognition and Memory: Cognition and memory normal.        Judgment: Judgment is impulsive.    ROS Blood pressure 122/76, pulse 64, temperature 98.3 F (36.8 C), resp. rate (!) 22, height 5\' 7"  (1.702 m), weight 70.8 kg, SpO2 98%. Body mass index is 24.43 kg/m.   Treatment Plan Summary: Daily contact with patient to assess and evaluate symptoms and progress in treatment and Medication  management Sertraline (Zoloft) 50 mg daily for depression. Risperidone 1 mg nightly to address auditory hallucinations and mood instability. Lamotrigine 25 mg daily, titrate as tolerated for mood stabilization. Discontinue Adderall XR 25 mg daily to reduce risk of exacerbating anxiety or psychosis. Prescribe Melatonin 5 mg nightly for sleep support. Psychoeducation on coping mechanisms for depressive symptoms and auditory hallucinations.  Myriam Forehand, NP 07/04/2023, 1:33 PM

## 2023-07-04 NOTE — Group Note (Signed)
Date:  07/04/2023 Time:  1:09 PM  Group Topic/Focus:  Conflict Resolution:   The focus of this group is to discuss the conflict resolution process and how it may be used upon discharge. Emotional Education:   The focus of this group is to discuss what feelings/emotions are, and how they are experienced. Managing Feelings:   The focus of this group is to identify what feelings patients have difficulty handling and develop a plan to handle them in a healthier way upon discharge.    Participation Level:  Active  Participation Quality:  Appropriate  Affect:  Appropriate  Cognitive:  Appropriate  Insight: Good  Engagement in Group:  Engaged  Modes of Intervention:  Activity  Additional Comments:    Ian [name] 07/04/2023, 1:09 PM

## 2023-07-05 DIAGNOSIS — F322 Major depressive disorder, single episode, severe without psychotic features: Secondary | ICD-10-CM | POA: Diagnosis not present

## 2023-07-05 NOTE — Group Note (Signed)
Date:  07/05/2023 Time:  11:46 AM  Group Topic/Focus:  Spirituality:   The focus of this group is to discuss how one's spirituality can aide in recovery.    Participation Level:  Active  Participation Quality:  Appropriate  Affect:  Appropriate  Cognitive:  Appropriate  Insight: Good  Engagement in Group:  Engaged  Modes of Intervention:  Activity  Additional Comments:    Ian [name] 07/05/2023, 11:46 AM

## 2023-07-05 NOTE — Plan of Care (Signed)
  Problem: Education: Goal: Knowledge of [place] [facility] Education information/materials will improve Outcome: Progressing Goal: Emotional status will improve Outcome: Progressing Goal: Mental status will improve Outcome: Progressing Goal: Verbalization of understanding the information provided will improve Outcome: Progressing   Problem: Activity: Goal: Interest or engagement in activities will improve Outcome: Progressing   Problem: Coping: Goal: Ability to verbalize frustrations and anger appropriately will improve Outcome: Progressing   Problem: Health Behavior/Discharge Planning: Goal: Identification of resources available to assist in meeting health care needs will improve Outcome: Progressing Goal: Compliance with treatment plan for underlying cause of condition will improve Outcome: Progressing   Problem: Physical Regulation: Goal: Ability to maintain clinical measurements within normal limits will improve Outcome: Progressing   Problem: Safety: Goal: Periods of time without injury will increase Outcome: Progressing   Problem: Education: Goal: Utilization of techniques to improve thought processes will improve Outcome: Progressing

## 2023-07-05 NOTE — BH Assessment (Signed)
Patient complaint with policy. Denies SI,HI,AVH and pain.

## 2023-07-05 NOTE — Plan of Care (Signed)
  Problem: Activity: Goal: Interest or engagement in activities will improve Outcome: Progressing   Problem: Physical Regulation: Goal: Ability to maintain clinical measurements within normal limits will improve Outcome: Progressing   Problem: Activity: Goal: Interest or engagement in leisure activities will improve Outcome: Progressing   Problem: Safety: Goal: Ability to disclose and discuss suicidal ideas will improve Outcome: Progressing

## 2023-07-05 NOTE — Group Note (Signed)
Date:  07/05/2023 Time:  11:21 PM  Group Topic/Focus:  Wrap-Up Group:   The focus of this group is to help patients review their daily goal of treatment and discuss progress on daily workbooks.    Participation Level:  Active  Participation Quality:  Appropriate  Affect:  Appropriate  Cognitive:  Appropriate  Insight: Appropriate  Engagement in Group:  Engaged  Modes of Intervention:  Discussion  Additional Comments:  Pt spoke about how wanted to go home over the weekend, with the doctors help he realized he was not ready to go home and was able to accept it and will wait for the doctor to give him an acceptable discharge date.  Tacy Dura 07/05/2023, 11:21 PM

## 2023-07-05 NOTE — Progress Notes (Signed)
[facility] MD Progress Note  07/05/2023 4:10 PM Ian [name]  MRN:  [id number] Subjective:  22 year old African American male presenting with ongoing auditory hallucinations. He states, "I am still hearing voices. They are telling me to hurt myself." The patient appears distressed and reports that the voices have been persistent and intrusive. He denies any specific plan or intent to act on these commands at this time but acknowledges that the voices are difficult to ignore. He denies any visual hallucinations or delusions. Principal Problem: MDD (major depressive disorder), severe (HCC) Diagnosis: Principal Problem:   MDD (major depressive disorder), severe (HCC)  Total Time spent with patient: 45 minutes  Past Psychiatric History: see below  Past Medical History:  Past Medical History:  Diagnosis Date   Acne    ADHD (attention deficit hyperactivity disorder)    Allergy    Asthma    Functional murmur    Migraines     Past Surgical History:  Procedure Laterality Date   NO PAST SURGERIES  2017   Family History:  Family History  Problem Relation Age of Onset   Hypertension Mother    Cancer Father        died at 40,unsure type, possibly colon   Hypertension Father    Stroke Maternal Grandmother 31   Heart disease Neg Hx    Family Psychiatric  History: none reported Social History:  Social History   Substance and Sexual Activity  Alcohol Use No     Social History   Substance and Sexual Activity  Drug Use No    Social History   Socioeconomic History   Marital status: Single    Spouse name: Not on file   Number of children: Not on file   Years of education: Not on file   Highest education level: Not on file  Occupational History   Not on file  Tobacco Use   Smoking status: Never   Smokeless tobacco: Never  Substance and Sexual Activity   Alcohol use: No   Drug use: No   Sexual activity: Not on file  Other Topics Concern   Not on file  Social History  Narrative   Lives with mom and older brother. No pets or tobacco exposure (outside dog).  10th grade, [name].   Social Drivers of Corporate investment banker Strain: Not on file  Food Insecurity: Food Insecurity Present (06/30/2023)   Hunger Vital Sign    Worried About Running Out of Food in the Last Year: Never true    Ran Out of Food in the Last Year: Sometimes true  Transportation Needs: No Transportation Needs (06/30/2023)   PRAPARE - Administrator, Civil Service (Medical): No    Lack of Transportation (Non-Medical): No  Physical Activity: Insufficiently Active (02/09/2023)   Exercise Vital Sign    Days of Exercise per Week: 3 days    Minutes of Exercise per Session: 30 min  Stress: Not on file  Social Connections: Unknown (12/03/2021)   Received from [facility], [facility]   Social Network    Social Network: Not on file   Additional Social History:                         Sleep: Good  Appetite:  Good  Current Medications: Current Facility-Administered Medications  Medication Dose Route Frequency Provider Last Rate Last Admin   acetaminophen (TYLENOL) tablet 650 mg  650 mg Oral Q6H PRN Ardis Hughs, NP  alum & mag hydroxide-simeth (MAALOX/MYLANTA) 200-200-20 MG/5ML suspension 15 mL  15 mL Oral Q6H PRN Ardis Hughs, NP       haloperidol (HALDOL) tablet 5 mg  5 mg Oral TID PRN Ardis Hughs, NP       And   diphenhydrAMINE (BENADRYL) capsule 50 mg  50 mg Oral TID PRN Ardis Hughs, NP       haloperidol lactate (HALDOL) injection 5 mg  5 mg Intramuscular TID PRN Ardis Hughs, NP       And   diphenhydrAMINE (BENADRYL) injection 50 mg  50 mg Intramuscular TID PRN Ardis Hughs, NP       And   LORazepam (ATIVAN) injection 2 mg  2 mg Intramuscular TID PRN Ardis Hughs, NP       haloperidol lactate (HALDOL) injection 10 mg  10 mg Intramuscular TID PRN Ardis Hughs, NP       And   diphenhydrAMINE  (BENADRYL) injection 50 mg  50 mg Intramuscular TID PRN Ardis Hughs, NP       And   LORazepam (ATIVAN) injection 2 mg  2 mg Intramuscular TID PRN Ardis Hughs, NP       hydrOXYzine (ATARAX) tablet 25 mg  25 mg Oral TID PRN Ardis Hughs, NP       lamoTRIgine (LAMICTAL) tablet 25 mg  25 mg Oral Daily Myriam Forehand, NP   25 mg at 07/05/23 8413   melatonin tablet 5 mg  5 mg Oral QHS Myriam Forehand, NP   5 mg at 07/04/23 2104   risperiDONE (RISPERDAL) tablet 1 mg  1 mg Oral QHS Myriam Forehand, NP   1 mg at 07/04/23 2104   sertraline (ZOLOFT) tablet 50 mg  50 mg Oral Daily Myriam Forehand, NP   50 mg at 07/05/23 2440   traZODone (DESYREL) tablet 50 mg  50 mg Oral QHS PRN Myriam Forehand, NP        Lab Results: No results found for this or any previous visit (from the past 48 hours).  Blood Alcohol level:  Lab Results  Component Value Date   ETH <10 06/30/2023    Metabolic Disorder Labs: Lab Results  Component Value Date   HGBA1C 5.0 06/30/2023   MPG 96.8 06/30/2023   No results found for: "PROLACTIN" Lab Results  Component Value Date   CHOL 133 02/09/2023   TRIG 37 02/09/2023   HDL 53 02/09/2023   CHOLHDL 2.5 02/09/2023   VLDL 9 05/16/2016   LDLCALC 71 02/09/2023   LDLCALC 70 05/16/2016    Physical Findings: AIMS:  , ,  ,  ,    CIWA:    COWS:     Musculoskeletal: Strength & Muscle Tone: within normal limits Gait & Station: normal Patient leans: N/A  Psychiatric Specialty Exam:  Presentation  General Appearance:  Fairly Groomed  Eye Contact: Good  Speech: Clear and Coherent  Speech Volume: Normal  Handedness: Right   Mood and Affect  Mood: Anxious; Irritable  Affect: Flat   Thought Process  Thought Processes: Goal Directed  Descriptions of Associations:Intact  Orientation:Full (Time, Place and Person)  Thought Content:WDL  History of Schizophrenia/Schizoaffective disorder:No  Duration of Psychotic Symptoms:No data  recorded Hallucinations:Hallucinations: Auditory Description of Auditory Hallucinations: "mumbles"  Ideas of Reference:None  Suicidal Thoughts:Suicidal Thoughts: Yes, Passive SI Active Intent and/or Plan: Without Intent; Without Plan; Without Means to Carry Out; Without Access to Means SI Passive  Intent and/or Plan: Without Intent; Without Means to Carry Out; Without Plan; Without Access to Means  Homicidal Thoughts:Homicidal Thoughts: No HI Passive Intent and/or Plan: -- (denies)   Sensorium  Memory: Immediate Good; Remote Fair  Judgment: Fair  Insight: Fair   Executive Functions  Concentration: Good  Attention Span: Fair  Recall: Good  Fund of Knowledge: Good  Language: Good   Psychomotor Activity  Psychomotor Activity: Psychomotor Activity: Normal   Assets  Assets: Communication Skills; Resilience; Desire for Improvement   Sleep  Sleep: Sleep: Good Number of Hours of Sleep: 7    Physical Exam: Physical Exam Vitals and nursing note reviewed.  Constitutional:      Appearance: Normal appearance.  HENT:     Head: Normocephalic and atraumatic.     Nose: Nose normal.  Pulmonary:     Effort: Pulmonary effort is normal.  Musculoskeletal:        General: Normal range of motion.     Cervical back: Normal range of motion.  Neurological:     General: No focal deficit present.     Mental Status: He is alert and oriented to person, place, and time. Mental status is at baseline.  Psychiatric:        Attention and Perception: He perceives auditory hallucinations.        Mood and Affect: Mood is anxious. Affect is flat.        Speech: Speech normal.        Behavior: Behavior normal. Behavior is cooperative.        Thought Content: Thought content includes suicidal ideation.        Cognition and Memory: Cognition and memory normal.        Judgment: Judgment is impulsive.    Review of Systems  Psychiatric/Behavioral:  Positive for suicidal ideas.  The patient is nervous/anxious.   All other systems reviewed and are negative.  Blood pressure 136/79, pulse 61, temperature 98.5 F (36.9 C), resp. rate (!) 21, height 5\' 7"  (1.702 m), weight 70.8 kg, SpO2 100%. Body mass index is 24.43 kg/m.   Treatment Plan Summary: Daily contact with patient to assess and evaluate symptoms and progress in treatment and Medication management Sertraline (Zoloft) 50 mg daily for depression. Risperidone 1 mg nightly to address auditory hallucinations and mood instability. Lamotrigine 25 mg daily, titrate as tolerated for mood stabilization. Discontinue Adderall XR 25 mg daily to reduce risk of exacerbating anxiety or psychosis. Prescribe Melatonin 5 mg nightly for sleep support. Psychoeducation on coping mechanisms for depressive symptoms and auditory hallucinations.  Myriam Forehand, NP 07/05/2023, 4:10 PM

## 2023-07-06 DIAGNOSIS — F322 Major depressive disorder, single episode, severe without psychotic features: Secondary | ICD-10-CM | POA: Diagnosis not present

## 2023-07-06 MED ORDER — MELATONIN 5 MG PO TABS
5.0000 mg | ORAL_TABLET | Freq: Every evening | ORAL | 0 refills | Status: DC | PRN
  Filled 2023-07-06: qty 30, 30d supply, fill #0

## 2023-07-06 MED ORDER — SERTRALINE HCL 50 MG PO TABS
50.0000 mg | ORAL_TABLET | Freq: Every day | ORAL | 0 refills | Status: DC
  Filled 2023-07-06: qty 30, 30d supply, fill #0

## 2023-07-06 MED ORDER — HYDROXYZINE HCL 25 MG PO TABS
25.0000 mg | ORAL_TABLET | Freq: Three times a day (TID) | ORAL | 0 refills | Status: DC | PRN
  Filled 2023-07-06: qty 30, 10d supply, fill #0

## 2023-07-06 MED ORDER — LAMOTRIGINE 25 MG PO TABS
25.0000 mg | ORAL_TABLET | Freq: Every day | ORAL | 0 refills | Status: DC
  Filled 2023-07-06: qty 30, 30d supply, fill #0

## 2023-07-06 MED ORDER — RISPERIDONE 1 MG PO TABS
1.0000 mg | ORAL_TABLET | Freq: Every day | ORAL | 0 refills | Status: DC
  Filled 2023-07-06: qty 30, 30d supply, fill #0

## 2023-07-06 NOTE — Group Note (Signed)
Recreation Therapy Group Note   Group Topic:General Recreation  Group Date: 07/06/2023 Start Time: 1530 End Time: 1645 Facilitators: Rosina Lowenstein, LRT, CTRS Location:  Dayroom  Group Description: Recreation. Patients were given the opportunity to play cards, journal, or listen to music during group. Pt identified and conversated about things they enjoy doing in their free time and how they can continue to do that outside of the hospital.  Goal Area(s) Addressed: Patient will practice making a positive decision. Patient will have the opportunity to try a new leisure activity. Patient will communicate with peers and LRT.   Affect/Mood: N/A   Participation Level: Did not attend    Clinical Observations/Individualized Feedback: Ian [name] was in and out of group. Pt was not present for majority of session.   Plan: Continue to engage patient in RT group sessions 2-3x/week.   Rosina Lowenstein, LRT, CTRS 07/06/2023 5:20 PM

## 2023-07-06 NOTE — Progress Notes (Signed)
   07/06/23 0800  Psych Admission Type (Psych Patients Only)  Admission Status Voluntary  Psychosocial Assessment  Patient Complaints None  Eye Contact Brief  Facial Expression Fixed smile  Affect Appropriate to circumstance  Speech Logical/coherent  Interaction Assertive  Motor Activity Other (Comment) (WDL)  Appearance/Hygiene In scrubs  Behavior Characteristics Cooperative;Appropriate to situation  Mood Pleasant  Aggressive Behavior  Effect No apparent injury  Thought Process  Coherency Circumstantial  Content WDL  Delusions None reported or observed  Perception WDL  Hallucination None reported or observed  Judgment Impaired  Confusion None  Danger to Self  Current suicidal ideation? Denies  Agreement Not to Harm Self Yes  Description of Agreement Verbal  Danger to Others  Danger to Others None reported or observed

## 2023-07-06 NOTE — BH IP Treatment Plan (Signed)
Interdisciplinary Treatment and Diagnostic Plan Update  07/06/2023 Time of Session: 9:00AM Ian [name] MRN: [id number]  Principal Diagnosis: MDD (major depressive disorder), severe (HCC)  Secondary Diagnoses: Principal Problem:   MDD (major depressive disorder), severe (HCC)   Current Medications:  Current Facility-Administered Medications  Medication Dose Route Frequency Provider Last Rate Last Admin   acetaminophen (TYLENOL) tablet 650 mg  650 mg Oral Q6H PRN Ardis Hughs, NP       alum & mag hydroxide-simeth (MAALOX/MYLANTA) 200-200-20 MG/5ML suspension 15 mL  15 mL Oral Q6H PRN Ardis Hughs, NP       haloperidol (HALDOL) tablet 5 mg  5 mg Oral TID PRN Ardis Hughs, NP       And   diphenhydrAMINE (BENADRYL) capsule 50 mg  50 mg Oral TID PRN Ardis Hughs, NP       haloperidol lactate (HALDOL) injection 5 mg  5 mg Intramuscular TID PRN Ardis Hughs, NP       And   diphenhydrAMINE (BENADRYL) injection 50 mg  50 mg Intramuscular TID PRN Ardis Hughs, NP       And   LORazepam (ATIVAN) injection 2 mg  2 mg Intramuscular TID PRN Ardis Hughs, NP       haloperidol lactate (HALDOL) injection 10 mg  10 mg Intramuscular TID PRN Ardis Hughs, NP       And   diphenhydrAMINE (BENADRYL) injection 50 mg  50 mg Intramuscular TID PRN Ardis Hughs, NP       And   LORazepam (ATIVAN) injection 2 mg  2 mg Intramuscular TID PRN Ardis Hughs, NP       hydrOXYzine (ATARAX) tablet 25 mg  25 mg Oral TID PRN Ardis Hughs, NP       lamoTRIgine (LAMICTAL) tablet 25 mg  25 mg Oral Daily Myriam Forehand, NP   25 mg at 07/06/23 0804   melatonin tablet 5 mg  5 mg Oral QHS Myriam Forehand, NP   5 mg at 07/05/23 2112   risperiDONE (RISPERDAL) tablet 1 mg  1 mg Oral QHS Myriam Forehand, NP   1 mg at 07/05/23 2112   sertraline (ZOLOFT) tablet 50 mg  50 mg Oral Daily Myriam Forehand, NP   50 mg at 07/06/23 0805   traZODone (DESYREL) tablet 50 mg  50 mg  Oral QHS PRN Myriam Forehand, NP       PTA Medications: Medications Prior to Admission  Medication Sig Dispense Refill Last Dose/Taking   amphetamine-dextroamphetamine (ADDERALL XR) 25 MG 24 hr capsule Take 1 capsule by mouth every morning. 30 capsule 0    Multiple Vitamins-Minerals (MULTIVITAMIN GUMMIES ADULTS PO) Take 1 tablet by mouth daily.      sertraline (ZOLOFT) 25 MG tablet Take 1 tablet (25 mg total) by mouth daily.       Patient Stressors:    Patient Strengths:    Treatment Modalities: Medication Management, Group therapy, Case management,  1 to 1 session with clinician, Psychoeducation, Recreational therapy.   Physician Treatment Plan for Primary Diagnosis: MDD (major depressive disorder), severe (HCC) Long Term Goal(s): Improvement in symptoms so as ready for discharge   Short Term Goals: Ability to identify changes in lifestyle to reduce recurrence of condition will improve Ability to verbalize feelings will improve Ability to disclose and discuss suicidal ideas Ability to demonstrate self-control will improve Ability to identify and develop effective coping behaviors will improve Ability to  maintain clinical measurements within normal limits will improve Compliance with prescribed medications will improve Ability to identify triggers associated with substance abuse/mental health issues will improve  Medication Management: Evaluate patient's response, side effects, and tolerance of medication regimen.  Therapeutic Interventions: 1 to 1 sessions, Unit Group sessions and Medication administration.  Evaluation of Outcomes: Not Met  Physician Treatment Plan for Secondary Diagnosis: Principal Problem:   MDD (major depressive disorder), severe (HCC)  Long Term Goal(s): Improvement in symptoms so as ready for discharge   Short Term Goals: Ability to identify changes in lifestyle to reduce recurrence of condition will improve Ability to verbalize feelings will  improve Ability to disclose and discuss suicidal ideas Ability to demonstrate self-control will improve Ability to identify and develop effective coping behaviors will improve Ability to maintain clinical measurements within normal limits will improve Compliance with prescribed medications will improve Ability to identify triggers associated with substance abuse/mental health issues will improve     Medication Management: Evaluate patient's response, side effects, and tolerance of medication regimen.  Therapeutic Interventions: 1 to 1 sessions, Unit Group sessions and Medication administration.  Evaluation of Outcomes: Not Met   RN Treatment Plan for Primary Diagnosis: MDD (major depressive disorder), severe (HCC) Long Term Goal(s): Knowledge of disease and therapeutic regimen to maintain health will improve  Short Term Goals: Ability to verbalize frustration and anger appropriately will improve, Ability to demonstrate self-control, Ability to participate in decision making will improve, Ability to verbalize feelings will improve, Ability to disclose and discuss suicidal ideas, and Ability to identify and develop effective coping behaviors will improve  Medication Management: RN will administer medications as ordered by provider, will assess and evaluate patient's response and provide education to patient for prescribed medication. RN will report any adverse and/or side effects to prescribing provider.  Therapeutic Interventions: 1 on 1 counseling sessions, Psychoeducation, Medication administration, Evaluate responses to treatment, Monitor vital signs and CBGs as ordered, Perform/monitor CIWA, COWS, AIMS and Fall Risk screenings as ordered, Perform wound care treatments as ordered.  Evaluation of Outcomes: Not Met   LCSW Treatment Plan for Primary Diagnosis: MDD (major depressive disorder), severe (HCC) Long Term Goal(s): Safe transition to appropriate next level of care at discharge,  Engage patient in therapeutic group addressing interpersonal concerns.  Short Term Goals: Engage patient in aftercare planning with referrals and resources, Increase social support, Increase ability to appropriately verbalize feelings, Increase emotional regulation, Facilitate acceptance of mental health diagnosis and concerns, Facilitate patient progression through stages of change regarding substance use diagnoses and concerns, and Increase skills for wellness and recovery  Therapeutic Interventions: Assess for all discharge needs, 1 to 1 time with Social worker, Explore available resources and support systems, Assess for adequacy in community support network, Educate family and significant other(s) on suicide prevention, Complete Psychosocial Assessment, Interpersonal group therapy.  Evaluation of Outcomes: Not Met   Progress in Treatment: Attending groups: Yes. Participating in groups: Yes. Taking medication as prescribed: Yes. Toleration medication: Yes. Family/Significant other contact made: Yes, individual(s) contacted:   No, will contact:  if given permission. 07/06/23 Update: Lynwood Dawley, aunt, [phone] has been identified by the patient as the family member with whom the patient will be residing, and identified as the person who will aid the patient in the event of a mental health crisis. With written consent from the patient, the family member has been provided suicide prevention education .  Patient understands diagnosis: Yes. Discussing patient identified problems/goals with staff: Yes. Medical problems stabilized  or resolved: Yes. and No. Denies suicidal/homicidal ideation: No. Issues/concerns per patient self-inventory:  No. 07/06/23 Update: Yes. Patient endorses present SI and feelings of hopelessness.  Other: none. 07/06/23 Update: Patient was due for discharge but felt that he was not ready and wanted more time for treatment. Patient is still active in group, active  in treatment and compliant with medications.   New problem(s) identified: No, Describe:  none identified. 07/06/23 Update: Patient has endorsed SI and has spoken with CSW about coming to terms with adolescent trauma surrounding sexuality.   New Short Term/Long Term Goal(s): medication management for mood stabilization; elimination of SI thoughts; development of comprehensive mental wellness/sobriety plan. 07/06/23 Update: Goal to remain the same.    Patient Goals:  "Really just to get out of here and back on my feet."  07/06/23 Update: Patient reports to CSW that his goal is to understand his boundaries, strengthen his emotional regulation and work to better communicate his needs.   Discharge Plan or Barriers: CSW will assist pt with development of an appropriate aftercare/discharge plan.  07/06/23 Update: Discharge plan to remain the same.   Reason for Continuation of Hospitalization: Depression Medication stabilization Suicidal ideation 07/06/23 Update: Aggression Anxiety Depression Medication stabilization Suicidal ideation  Estimated Length of Stay: 1-7 days 07/06/23 Update: TBD  Last 3 [place] Suicide Severity Risk Score: Flowsheet Row Admission (Current) from 06/30/2023 in [facility] INPATIENT BEHAVIORAL MEDICINE Most recent reading at 06/30/2023  2:00 PM ED from 06/30/2023 in [facility] Most recent reading at 06/30/2023  6:50 AM ED from 06/29/2023 in [facility] Emergency Department at [facility] Most recent reading at 06/30/2023  4:13 AM  C-SSRS RISK CATEGORY High Risk No Risk Error: Q7 should not be populated when Q6 is No       Last PHQ 2/9 Scores:    02/09/2023   11:04 AM 07/22/2022    1:17 PM 07/30/2020    8:30 AM  Depression screen PHQ 2/9  Decreased Interest 2 0 0  Down, Depressed, Hopeless 3 0 0  PHQ - 2 Score 5 0 0  Altered sleeping 3    Tired, decreased energy 3    Change in appetite 2    Feeling bad or failure about yourself   3    Trouble concentrating 2    Moving slowly or fidgety/restless 2    Suicidal thoughts 3    PHQ-9 Score 23    Difficult doing work/chores Not difficult at all      Scribe for Treatment Team: Lowry Ram, LCSW 07/06/2023 10:39 AM

## 2023-07-06 NOTE — Progress Notes (Addendum)
[facility] MD Progress Note  07/06/2023 5:58 PM Ian [name]  MRN:  [id number] Subjective:  22 year old African American male who states, "I am ready to go home tomorrow" and "I can handle better my life." He denies suicidal ideation (SI), homicidal ideation (HI), and auditory/visual hallucinations (AVH). The patient appears motivated and future-oriented.The patient demonstrates emotional stability, improved insight, and motivation to manage his life outside the inpatient setting. He denies acute psychiatric symptoms and expresses readiness for discharge. No immediate safety concerns are present. Principal Problem: MDD (major depressive disorder), severe (HCC) Diagnosis: Principal Problem:   MDD (major depressive disorder), severe (HCC)  Total Time spent with patient: 45 minutes  Past Psychiatric History: see below  Past Medical History:  Past Medical History:  Diagnosis Date   Acne    ADHD (attention deficit hyperactivity disorder)    Allergy    Asthma    Functional murmur    Migraines     Past Surgical History:  Procedure Laterality Date   NO PAST SURGERIES  2017   Family History:  Family History  Problem Relation Age of Onset   Hypertension Mother    Cancer Father        died at 40,unsure type, possibly colon   Hypertension Father    Stroke Maternal Grandmother 37   Heart disease Neg Hx    Family Psychiatric  History: none reported Social History:  Social History   Substance and Sexual Activity  Alcohol Use No     Social History   Substance and Sexual Activity  Drug Use No    Social History   Socioeconomic History   Marital status: Single    Spouse name: Not on file   Number of children: Not on file   Years of education: Not on file   Highest education level: Not on file  Occupational History   Not on file  Tobacco Use   Smoking status: Never   Smokeless tobacco: Never  Substance and Sexual Activity   Alcohol use: No   Drug use: No   Sexual activity:  Not on file  Other Topics Concern   Not on file  Social History Narrative   Lives with mom and older brother. No pets or tobacco exposure (outside dog).  10th grade, [name]   Social Drivers of Corporate investment banker Strain: Not on file  Food Insecurity: Food Insecurity Present (06/30/2023)   Hunger Vital Sign    Worried About Running Out of Food in the Last Year: Never true    Ran Out of Food in the Last Year: Sometimes true  Transportation Needs: No Transportation Needs (06/30/2023)   PRAPARE - Administrator, Civil Service (Medical): No    Lack of Transportation (Non-Medical): No  Physical Activity: Insufficiently Active (02/09/2023)   Exercise Vital Sign    Days of Exercise per Week: 3 days    Minutes of Exercise per Session: 30 min  Stress: Not on file  Social Connections: Unknown (12/03/2021)   Received from [facility]   Social Network    Social Network: Not on file   Additional Social History:                         Sleep: Good  Appetite:  Good  Current Medications: Current Facility-Administered Medications  Medication Dose Route Frequency Provider Last Rate Last Admin   acetaminophen (TYLENOL) tablet 650 mg  650 mg Oral Q6H PRN Vernard Gambles  H, NP       alum & mag hydroxide-simeth (MAALOX/MYLANTA) 200-200-20 MG/5ML suspension 15 mL  15 mL Oral Q6H PRN Ardis Hughs, NP       haloperidol (HALDOL) tablet 5 mg  5 mg Oral TID PRN Ardis Hughs, NP       And   diphenhydrAMINE (BENADRYL) capsule 50 mg  50 mg Oral TID PRN Ardis Hughs, NP       haloperidol lactate (HALDOL) injection 5 mg  5 mg Intramuscular TID PRN Ardis Hughs, NP       And   diphenhydrAMINE (BENADRYL) injection 50 mg  50 mg Intramuscular TID PRN Ardis Hughs, NP       And   LORazepam (ATIVAN) injection 2 mg  2 mg Intramuscular TID PRN Ardis Hughs, NP       haloperidol lactate (HALDOL) injection 10 mg  10 mg Intramuscular  TID PRN Ardis Hughs, NP       And   diphenhydrAMINE (BENADRYL) injection 50 mg  50 mg Intramuscular TID PRN Ardis Hughs, NP       And   LORazepam (ATIVAN) injection 2 mg  2 mg Intramuscular TID PRN Ardis Hughs, NP       hydrOXYzine (ATARAX) tablet 25 mg  25 mg Oral TID PRN Ardis Hughs, NP       lamoTRIgine (LAMICTAL) tablet 25 mg  25 mg Oral Daily Myriam Forehand, NP   25 mg at 07/06/23 0804   melatonin tablet 5 mg  5 mg Oral QHS Myriam Forehand, NP   5 mg at 07/05/23 2112   risperiDONE (RISPERDAL) tablet 1 mg  1 mg Oral QHS Myriam Forehand, NP   1 mg at 07/05/23 2112   sertraline (ZOLOFT) tablet 50 mg  50 mg Oral Daily Myriam Forehand, NP   50 mg at 07/06/23 0805   traZODone (DESYREL) tablet 50 mg  50 mg Oral QHS PRN Myriam Forehand, NP        Lab Results: No results found for this or any previous visit (from the past 48 hours).  Blood Alcohol level:  Lab Results  Component Value Date   ETH <10 06/30/2023    Metabolic Disorder Labs: Lab Results  Component Value Date   HGBA1C 5.0 06/30/2023   MPG 96.8 06/30/2023   No results found for: "PROLACTIN" Lab Results  Component Value Date   CHOL 133 02/09/2023   TRIG 37 02/09/2023   HDL 53 02/09/2023   CHOLHDL 2.5 02/09/2023   VLDL 9 05/16/2016   LDLCALC 71 02/09/2023   LDLCALC 70 05/16/2016       Musculoskeletal: Strength & Muscle Tone: within normal limits Gait & Station: normal Patient leans: N/A  Psychiatric Specialty Exam:  Presentation  General Appearance:  Appropriate for Environment; Well Groomed  Eye Contact: Good  Speech: Clear and Coherent; Normal Rate  Speech Volume: Normal  Handedness: Right   Mood and Affect  Mood: Euthymic  Affect: Appropriate; Congruent   Thought Process  Thought Processes: Coherent  Descriptions of Associations:Intact  Orientation:Full (Time, Place and Person) (and situation)  Thought Content:Logical; WDL  History of  Schizophrenia/Schizoaffective disorder:No  Duration of Psychotic Symptoms:none reported Hallucinations:Hallucinations: None Description of Auditory Hallucinations: none  Ideas of Reference:None  Suicidal Thoughts:Suicidal Thoughts: Yes, Passive SI Active Intent and/or Plan: -- (denies) SI Passive Intent and/or Plan: -- (denies)  Homicidal Thoughts:Homicidal Thoughts: No HI Passive Intent and/or Plan: -- (  denies)   Sensorium  Memory: Immediate Good; Remote Good  Judgment: Fair  Insight: Fair   Executive Functions  Concentration: Good  Attention Span: Fair  Recall: Fair  Fund of Knowledge: Good  Language: Good   Psychomotor Activity  Psychomotor Activity: Psychomotor Activity: Normal   Assets  Assets: Communication Skills; Financial Resources/Insurance; Housing; Intimacy   Sleep  Sleep: Sleep: Good Number of Hours of Sleep: 7    Physical Exam: Physical Exam Vitals and nursing note reviewed.  Constitutional:      Appearance: Normal appearance.  HENT:     Head: Normocephalic and atraumatic.     Nose: Nose normal.  Pulmonary:     Effort: Pulmonary effort is normal.  Musculoskeletal:        General: Normal range of motion.     Cervical back: Normal range of motion.  Neurological:     General: No focal deficit present.     Mental Status: He is alert and oriented to person, place, and time. Mental status is at baseline.  Psychiatric:        Attention and Perception: Attention and perception normal.        Mood and Affect: Mood and affect normal.        Speech: Speech normal.        Behavior: Behavior normal. Behavior is cooperative.        Thought Content: Thought content normal.        Cognition and Memory: Cognition and memory normal.        Judgment: Judgment normal.    ROS Blood pressure (!) 146/79, pulse 67, temperature 98.4 F (36.9 C), temperature source Oral, resp. rate 16, height 5\' 7"  (1.702 m), weight 70.8 kg, SpO2 100%. Body  mass index is 24.43 kg/m.   Treatment Plan Summary: Daily contact with patient to assess and evaluate symptoms and progress in treatment and Medication management Sertraline (Zoloft) 50 mg daily for depression. Risperidone 1 mg nightly to address auditory hallucinations and mood instability. Prescribe Melatonin 5 mg nightly for sleep support. Psychoeducation on coping mechanisms for depressive symptoms and auditory hallucinations . Discharge the patient tomorrow, pending continued stability overnight. Myriam Forehand, NP Myriam Forehand, NP 07/06/2023, 5:58 PM

## 2023-07-06 NOTE — Group Note (Signed)
[facility] LCSW Group Therapy Note    Group Date: 07/06/2023 Start Time: 1315 End Time: 1415  Type of Therapy and Topic:  Group Therapy:  Overcoming Obstacles  Participation Level:  [facility] PARTICIPATION LEVEL: Active  Mood:  Description of Group:   In this group patients will be encouraged to explore what they see as obstacles to their own wellness and recovery. They will be guided to discuss their thoughts, feelings, and behaviors related to these obstacles. The group will process together ways to cope with barriers, with attention given to specific choices patients can make. Each patient will be challenged to identify changes they are motivated to make in order to overcome their obstacles. This group will be process-oriented, with patients participating in exploration of their own experiences as well as giving and receiving support and challenge from other group members.  Therapeutic Goals: 1. Patient will identify personal and current obstacles as they relate to admission. 2. Patient will identify barriers that currently interfere with their wellness or overcoming obstacles.  3. Patient will identify feelings, thought process and behaviors related to these barriers. 4. Patient will identify two changes they are willing to make to overcome these obstacles:    Summary of Patient Progress Patient was present in group.  Patient was active in group. Patient engaged in discussion on obstacles.  Patient was appropriate and responded well from feedback with others.  Patient displayed fair insight.    Therapeutic Modalities:   Cognitive Behavioral Therapy Solution Focused Therapy Motivational Interviewing Relapse Prevention Therapy   Harden Mo, LCSW

## 2023-07-06 NOTE — Group Note (Signed)
Date:  07/06/2023 Time:  9:39 PM  Group Topic/Focus:  Wrap-Up Group:   The focus of this group is to help patients review their daily goal of treatment and discuss progress on daily workbooks.    Participation Level:  Active  Participation Quality:  Appropriate and Attentive  Affect:  Appropriate  Cognitive:  Alert and Appropriate  Insight: Appropriate  Engagement in Group:  Engaged and None  Modes of Intervention:  Discussion  Additional Comments:     [name],Ian [name] 07/06/2023, 9:39 PM

## 2023-07-06 NOTE — Group Note (Signed)
Recreation Therapy Group Note   Group Topic:Coping Skills  Group Date: 07/06/2023 Start Time: 1000 End Time: 1100 Facilitators: Rosina Lowenstein, LRT, CTRS Location:  Craft Room  Group Description: Mind Map.  Patient was provided a blank template of a diagram with 32 blank boxes in a tiered system, branching from the center (similar to a bubble chart). LRT directed patients to label the middle of the diagram "Coping Skills". LRT and patients then came up with 8 different coping skills as examples. Pt were directed to record their coping skills in the 2nd tier boxes closest to the center.  Patients would then share their coping skills with the group as LRT wrote them out. LRT gave a handout of 99 different coping skills at the end of group.   Goal Area(s) Addressed: Patients will be able to define "coping skills". Patient will identify new coping skills.  Patient will increase communication.   Affect/Mood: Appropriate   Participation Level: Active and Engaged   Participation Quality: Independent   Behavior: Appropriate, Calm, and Cooperative   Speech/Thought Process: Coherent   Insight: Good   Judgement: Good   Modes of Intervention: Education, Exploration, Guided Discussion, and Worksheet   Patient Response to Interventions:  Attentive, Engaged, Interested , and Receptive   Education Outcome:  Acknowledges education   Clinical Observations/Individualized Feedback: Ian [name] was active in their participation of session activities and group discussion. Pt identified "walk, talk, gym, sleep, hangout" as coping skills. Pt spontaneously contributed to group discussion while interacting well with LRT and peers duration of session.    Plan: Continue to engage patient in RT group sessions 2-3x/week.   Rosina Lowenstein, LRT, CTRS 07/06/2023 1:00 PM

## 2023-07-06 NOTE — Progress Notes (Signed)
   07/05/23 2000  Psych Admission Type (Psych Patients Only)  Admission Status Voluntary  Psychosocial Assessment  Patient Complaints None  Eye Contact Brief  Facial Expression Fixed smile  Affect Appropriate to circumstance  Speech Logical/coherent  Interaction Assertive;Cautious  Motor Activity Slow  Appearance/Hygiene In scrubs  Behavior Characteristics Cooperative;Appropriate to situation  Mood Pleasant  Aggressive Behavior  Effect No apparent injury  Thought Process  Coherency Circumstantial  Content WDL  Delusions None reported or observed  Perception WDL  Hallucination None reported or observed  Judgment Impaired  Confusion None  Danger to Self  Current suicidal ideation? Denies  Agreement Not to Harm Self Yes  Description of Agreement verbal  Danger to Others  Danger to Others None reported or observed   Patient alert and oriented x 4, no distress noted denies SI/HI/AVH, 15 minutes safety checks maintained .

## 2023-07-07 ENCOUNTER — Other Ambulatory Visit: Payer: Self-pay

## 2023-07-07 DIAGNOSIS — F332 Major depressive disorder, recurrent severe without psychotic features: Principal | ICD-10-CM | POA: Diagnosis present

## 2023-07-07 DIAGNOSIS — F819 Developmental disorder of scholastic skills, unspecified: Secondary | ICD-10-CM | POA: Diagnosis not present

## 2023-07-07 DIAGNOSIS — F4381 Prolonged grief disorder: Secondary | ICD-10-CM | POA: Diagnosis not present

## 2023-07-07 DIAGNOSIS — F411 Generalized anxiety disorder: Secondary | ICD-10-CM | POA: Diagnosis not present

## 2023-07-07 NOTE — Discharge Summary (Signed)
Physician Discharge Summary Note  Patient:  Ian [name] is an 22 y.o., male MRN:  [id number] DOB:  02-07-2001 Patient phone:  [phone] (home)  Patient address:   [address] [place] [address],  Total Time spent with patient: 45 minutes  Date of Admission:  06/30/2023 Date of Discharge: 07/07/2023  Reason for Admission:  depression with suicide attempt  Principal Problem: Major depressive disorder, recurrent severe without psychotic features [facility]) Discharge Diagnoses: Principal Problem:   Major depressive disorder, recurrent severe without psychotic features (HCC)   Past Psychiatric History: depression, ADHD, anxiey  Past Medical History:  Past Medical History:  Diagnosis Date   Acne    ADHD (attention deficit hyperactivity disorder)    Allergy    Asthma    Functional murmur    Migraines     Past Surgical History:  Procedure Laterality Date   NO PAST SURGERIES  2017   Family History:  Family History  Problem Relation Age of Onset   Hypertension Mother    Cancer Father        died at 40,unsure type, possibly colon   Hypertension Father    Stroke Maternal Grandmother 26   Heart disease Neg Hx    Family Psychiatric  History: none Social History:  Social History   Substance and Sexual Activity  Alcohol Use No     Social History   Substance and Sexual Activity  Drug Use No    Social History   Socioeconomic History   Marital status: Single    Spouse name: Not on file   Number of children: Not on file   Years of education: Not on file   Highest education level: Not on file  Occupational History   Not on file  Tobacco Use   Smoking status: Never   Smokeless tobacco: Never  Substance and Sexual Activity   Alcohol use: No   Drug use: No   Sexual activity: Not on file  Other Topics Concern   Not on file  Social History Narrative   Lives with mom and older brother. No pets or tobacco exposure (outside dog).  10th grade, [name]    Social Drivers of Corporate investment banker Strain: Not on file  Food Insecurity: Food Insecurity Present (06/30/2023)   Hunger Vital Sign    Worried About Running Out of Food in the Last Year: Never true    Ran Out of Food in the Last Year: Sometimes true  Transportation Needs: No Transportation Needs (06/30/2023)   PRAPARE - Administrator, Civil Service (Medical): No    Lack of Transportation (Non-Medical): No  Physical Activity: Insufficiently Active (02/09/2023)   Exercise Vital Sign    Days of Exercise per Week: 3 days    Minutes of Exercise per Session: 30 min  Stress: Not on file  Social Connections: Unknown (12/03/2021)   Received from [facility]   Social Network    Social Network: Not on file    Hospital Course:  22 y.o. male admitted to the inpatient unit due to suicide attempt via "stabbing self" in left forearm. He is alert and oriented x4. He has linear and goal oriented thought process. His laceration appears to be healing well- no redness, swelling or signs of infection noted. Medications and therapy started while patient on the inpatient unit. [name] is interactive and participative with treatment plan. He is agreeable to follow up with outpatient resources and continue medication management. Denies suicidal/homicidal ideations, hallucinations,  and substance abuse. [name] has met maximum capacity of hospitalizations. Discharge instructions provided with explanations along with crisis numbers, follow up appointment information, and Rx.   Musculoskeletal: Strength & Muscle Tone: within normal limits Gait & Station: normal Patient leans: N/A   Psychiatric Specialty Exam: Physical Exam Vitals and nursing note reviewed.  Constitutional:      Appearance: Normal appearance.  HENT:     Head: Normocephalic.     Nose: Nose normal.  Pulmonary:     Effort: Pulmonary effort is normal.  Musculoskeletal:        General: Normal range of  motion.     Cervical back: Normal range of motion.  Neurological:     General: No focal deficit present.     Mental Status: He is alert and oriented to person, place, and time.       Review of Systems  All other systems reviewed and are negative.    Blood pressure 120/74, pulse 67, temperature 97.8 F (36.6 C), temperature source Oral, resp. rate 18, height 5\' 7"  (1.702 m), weight 70.8 kg, SpO2 99%.Body mass index is 24.43 kg/m.  General Appearance: Casual  Eye Contact:  Good  Speech:  Normal Rate  Volume:  Normal  Mood:  Euthymic  Affect:  Congruent  Thought Process:  Coherent and Descriptions of Associations: Intact  Orientation:  Full (Time, Place, and Person)  Thought Content:  WDL and Logical  Suicidal Thoughts:  No  Homicidal Thoughts:  No  Memory:  Immediate;   Good Recent;   Good Remote;   Good  Judgement:  Fair  Insight:  Good  Psychomotor Activity:  Normal  Concentration:  Concentration: Good and Attention Span: Good  Recall:  Good  Fund of Knowledge:  Good  Language:  Good  Akathisia:  No  Handed:  Right  AIMS (if indicated):     Assets:  Housing Leisure Time Physical Health Resilience Social Support  ADL's:  Intact  Cognition:  WNL  Sleep:        Physical Exam: Physical Exam Vitals and nursing note reviewed.  Constitutional:      Appearance: Normal appearance.  HENT:     Head: Normocephalic.     Nose: Nose normal.  Pulmonary:     Effort: Pulmonary effort is normal.  Musculoskeletal:        General: Normal range of motion.     Cervical back: Normal range of motion.  Neurological:     General: No focal deficit present.     Mental Status: He is alert and oriented to person, place, and time.    Review of Systems  All other systems reviewed and are negative.  Blood pressure 120/74, pulse 67, temperature 97.8 F (36.6 C), temperature source Oral, resp. rate 18, height 5\' 7"  (1.702 m), weight 70.8 kg, SpO2 99%. Body mass index is 24.43  kg/m.   Social History   Tobacco Use  Smoking Status Never  Smokeless Tobacco Never   Tobacco Cessation:  N/A, patient does not currently use tobacco products   Blood Alcohol level:  Lab Results  Component Value Date   ETH <10 06/30/2023    Metabolic Disorder Labs:  Lab Results  Component Value Date   HGBA1C 5.0 06/30/2023   MPG 96.8 06/30/2023   No results found for: "PROLACTIN" Lab Results  Component Value Date   CHOL 133 02/09/2023   TRIG 37 02/09/2023   HDL 53 02/09/2023   CHOLHDL 2.5 02/09/2023   VLDL 9 05/16/2016  LDLCALC 71 02/09/2023   LDLCALC 70 05/16/2016    See Psychiatric Specialty Exam and Suicide Risk Assessment completed by Attending Physician prior to discharge.  Discharge destination:  Home  Is patient on multiple antipsychotic therapies at discharge:  No   Has Patient had three or more failed trials of antipsychotic monotherapy by history:  No  Recommended Plan for Multiple Antipsychotic Therapies: NA  Discharge Instructions     Diet - low sodium heart healthy   Complete by: As directed    Discharge instructions   Complete by: As directed    Follow up with outpatient appointment   Increase activity slowly   Complete by: As directed    No wound care   Complete by: As directed       Allergies as of 07/07/2023   No Known Allergies      Medication List     STOP taking these medications    amphetamine-dextroamphetamine 25 MG 24 hr capsule Commonly known as: Adderall XR   MULTIVITAMIN GUMMIES ADULTS PO       TAKE these medications      Indication  hydrOXYzine 25 MG tablet Commonly known as: ATARAX Take 1 tablet (25 mg total) by mouth every 8 (eight) hours as needed for anxiety.  Indication: Feeling Anxious, Feeling Tense   lamoTRIgine 25 MG tablet Commonly known as: LAMICTAL Take 1 tablet (25 mg total) by mouth daily.  Indication: Depressive Phase of Manic-Depression   melatonin 5 MG Tabs Take 1 tablet (5 mg  total) by mouth at bedtime as needed.  Indication: Trouble Sleeping   risperiDONE 1 MG tablet Commonly known as: RISPERDAL Take 1 tablet (1 mg total) by mouth at bedtime.  Indication: Major Depressive Disorder   sertraline 50 MG tablet Commonly known as: ZOLOFT Take 1 tablet (50 mg total) by mouth daily. What changed:  medication strength how much to take  Indication: Major Depressive Disorder        Follow-up Information     Center, [facility]. Go to.   Specialty: Behavioral Health Why: Appointment schedualed 07/07/23 at 4PM.  While there, please let front desk know that you need to scheduale a medication management appointment before your therapy appointment. Contact information: [address] [place] [address] [phone]                 Follow-up recommendations:  Activity:  as tolerated  Diet:  heart healthy diet Major depressive disorder, recurrent, severe without psychosis: Zoloft 50 mg daily Risperdal 1 mg at bedtime Lamictal 25 mg daily  Anxiety: Hydroxyzine 25 mg every 8 hours PRN  Insomnia: Melatonin 5 mg daiy at bedtime PRN  Comments:  follow up with appointment above  Signed: Nanine Means, NP 07/07/2023, 10:36 AM

## 2023-07-07 NOTE — Progress Notes (Signed)
   07/07/23 1108  Psych Admission Type (Psych Patients Only)  Admission Status Voluntary  Psychosocial Assessment  Patient Complaints None  Eye Contact Brief  Facial Expression Animated  Affect Appropriate to circumstance  Speech Logical/coherent  Interaction Assertive  Motor Activity Slow  Appearance/Hygiene In scrubs  Behavior Characteristics Cooperative  Mood Pleasant  Thought Process  Coherency Circumstantial  Content WDL  Delusions None reported or observed  Perception WDL  Hallucination None reported or observed  Judgment Impaired  Confusion None  Danger to Self  Current suicidal ideation? Denies  Agreement Not to Harm Self Yes  Description of Agreement Verbal  Danger to Others  Danger to Others None reported or observed   Denies suicidal ideation (SI), homicidal ideation (HI), and auditory/visual hallucinations (AVH). The patient denies any acute psychiatric symptoms, appearing emotionally stable, future-oriented, and motivated to manage his life outside the inpatient setting. Expresses readiness for discharge and shows a positive attitude toward his ability to handle life challenges moving forward. All discharge instructions were provided and confirmed to be understood by the patient. A 30-day supply of prescribed medications provided. Continue the current medication regimen and follow-up as scheduled. Encouraged continued engagement in therapy and community support as needed. Reaffirmed safety plan.

## 2023-07-07 NOTE — Progress Notes (Signed)
  [facility] Adult Case Management Discharge Plan :  Will you be returning to the same living situation after discharge:  Yes,  Patient to return home.  At discharge, do you have transportation home?: Yes,  CSW to provide patient with taxi service.  Do you have the ability to pay for your medications: Yes, [place] MEDICAID PREPAID HEALTH PLAN / [place] MEDICAID HEALTHY BLUE   Release of information consent forms completed and in the chart;  Patient's signature needed at discharge.  Patient to Follow up at:  Follow-up Information     Center, [facility]. Go to.   Specialty: Behavioral Health Why: Appointment schedualed 07/07/23 at 4PM.  While there, please let front desk know that you need to scheduale a medication management appointment before your therapy appointment. Contact information: [address] [place] [address] [phone]                 Next level of care provider has access to [facility] Link:no  Safety Planning and Suicide Prevention discussed: Yes,  SPE completed with patient's aunt with patient's consent. Patient provided with SPE materials at discharge.     Has patient been referred to the Quitline?: Patient does not use tobacco/nicotine products  Patient has been referred for addiction treatment: Patient refused referral for treatment.  Lowry Ram, LCSW 07/07/2023, 10:24 AM

## 2023-07-07 NOTE — BHH Suicide Risk Assessment (Signed)
[facility] Discharge Suicide Risk Assessment   Principal Problem: Major depressive disorder, recurrent severe without psychotic features [facility]) Discharge Diagnoses: Principal Problem:   Major depressive disorder, recurrent severe without psychotic features (HCC)   Total Time spent with patient: 45 minutes  Musculoskeletal: Strength & Muscle Tone: within normal limits Gait & Station: normal Patient leans: N/A  Psychiatric Specialty Exam: Physical Exam Vitals and nursing note reviewed.  Constitutional:      Appearance: Normal appearance.  HENT:     Head: Normocephalic.     Nose: Nose normal.  Pulmonary:     Effort: Pulmonary effort is normal.  Musculoskeletal:        General: Normal range of motion.     Cervical back: Normal range of motion.  Neurological:     General: No focal deficit present.     Mental Status: He is alert and oriented to person, place, and time.     Review of Systems  All other systems reviewed and are negative.   Blood pressure 120/74, pulse 67, temperature 97.8 F (36.6 C), temperature source Oral, resp. rate 18, height 5\' 7"  (1.702 m), weight 70.8 kg, SpO2 99%.Body mass index is 24.43 kg/m.  General Appearance: Casual  Eye Contact:  Good  Speech:  Normal Rate  Volume:  Normal  Mood:  Euthymic  Affect:  Congruent  Thought Process:  Coherent and Descriptions of Associations: Intact  Orientation:  Full (Time, Place, and Person)  Thought Content:  WDL and Logical  Suicidal Thoughts:  No  Homicidal Thoughts:  No  Memory:  Immediate;   Good Recent;   Good Remote;   Good  Judgement:  Fair  Insight:  Good  Psychomotor Activity:  Normal  Concentration:  Concentration: Good and Attention Span: Good  Recall:  Good  Fund of Knowledge:  Good  Language:  Good  Akathisia:  No  Handed:  Right  AIMS (if indicated):     Assets:  Housing Leisure Time Physical Health Resilience Social Support  ADL's:  Intact  Cognition:  WNL  Sleep:         Physical  Exam: Physical Exam Vitals and nursing note reviewed.  Constitutional:      Appearance: Normal appearance.  HENT:     Head: Normocephalic.     Nose: Nose normal.  Pulmonary:     Effort: Pulmonary effort is normal.  Musculoskeletal:        General: Normal range of motion.     Cervical back: Normal range of motion.  Neurological:     General: No focal deficit present.     Mental Status: He is alert and oriented to person, place, and time.    Review of Systems  All other systems reviewed and are negative.  Blood pressure 120/74, pulse 67, temperature 97.8 F (36.6 C), temperature source Oral, resp. rate 18, height 5\' 7"  (1.702 m), weight 70.8 kg, SpO2 99%. Body mass index is 24.43 kg/m.  Mental Status Per Nursing Assessment::   On Admission:  NA  Demographic Factors:  Male and Adolescent or young adult  Loss Factors: NA  Historical Factors: Prior suicide attempts  Risk Reduction Factors:   Sense of responsibility to family, Living with another person, especially a relative, Positive social support, and Positive therapeutic relationship  Continued Clinical Symptoms:  None  Cognitive Features That Contribute To Risk:  None    Suicide Risk:  Minimal: No identifiable suicidal ideation.  Patients presenting with no risk factors but with morbid ruminations; may be  classified as minimal risk based on the severity of the depressive symptoms   Follow-up Information     Center, [facility]. Go to.   Specialty: Behavioral Health Why: Appointment schedualed 07/07/23 at 4PM.  While there, please let front desk know that you need to scheduale a medication management appointment before your therapy appointment. Contact information: [address] [place] [address] [phone]                 Plan Of Care/Follow-up recommendations:  Activity:  as tolerated Diet:  heart healthy diet Major depressive disorder, recurrent, severe  without psychosis: Zoloft 50 mg daily Risperdal 1 mg at bedtime Lamictal 25 mg daily  Anxiety: Hydroxyzine 25 mg every 8 hours PRN  Insomnia: Melatonin 5 mg daiy at bedtime PRN  Nanine Means, NP 07/07/2023, 10:32 AM

## 2023-07-07 NOTE — Group Note (Signed)
Recreation Therapy Group Note   Group Topic:Goal Setting  Group Date: 07/07/2023 Start Time: 1000 End Time: 1100 Facilitators: Rosina Lowenstein, LRT, CTRS Location:  Craft Room  Group Description: Product/process development scientist. Patients were given many different magazines, a glue stick, markers, and a piece of cardstock paper. LRT and pts discussed the importance of having goals in life. LRT and pts discussed the difference between short-term and long-term goals, as well as what a SMART goal is. LRT encouraged pts to create a vision board, with images they picked and then cut out with safety scissors from the magazine, for themselves, that capture their short and long-term goals. LRT encouraged pts to show and explain their vision board to the group.   Goal Area(s) Addressed:  Patient will gain knowledge of short vs. long term goals.  Patient will identify goals for themselves. Patient will practice setting SMART goals. Patient will verbalize their goals to LRT and peers.   Affect/Mood: Appropriate   Participation Level: Active and Engaged   Participation Quality: Independent   Behavior: Appropriate, Calm, and Cooperative   Speech/Thought Process: Coherent   Insight: Good   Judgement: Good   Modes of Intervention: Art, Education, Guided Discussion, Socialization, and Support   Patient Response to Interventions:  Attentive, Engaged, Interested , and Receptive   Education Outcome:  Acknowledges education   Clinical Observations/Individualized Feedback: Ian [name] was active in their participation of session activities and group discussion. Pt identified "I want to learn how to cook, get a car, have my own family and house, and patch things up with my girl since I see a family with her. I also want to be apart of a young black mens group. I think that will help me feel empowered when if I am ever sad or depressed, help give me purpose". Pt interacted well with LRT and peers duration of session.     Plan: Continue to engage patient in RT group sessions 2-3x/week.   Rosina Lowenstein, LRT, CTRS 07/07/2023 12:42 PM

## 2023-07-07 NOTE — Plan of Care (Signed)
  Problem: Coping: Goal: Ability to verbalize frustrations and anger appropriately will improve Outcome: Progressing   Problem: Safety: Goal: Periods of time without injury will increase Outcome: Progressing   

## 2023-07-07 NOTE — Progress Notes (Signed)
Patient is pleasant and cooperative. Denies SI, HI, AVH. Medication compliant. Appropriate with staff and peers. Encouragement and support provided. Safety checks maintained. Medications given as prescribed. Pt receptive and remains safe on unit with q 15 min checks.

## 2023-07-28 DIAGNOSIS — F4381 Prolonged grief disorder: Secondary | ICD-10-CM | POA: Diagnosis not present

## 2023-07-28 DIAGNOSIS — F819 Developmental disorder of scholastic skills, unspecified: Secondary | ICD-10-CM | POA: Diagnosis not present

## 2023-07-28 DIAGNOSIS — F411 Generalized anxiety disorder: Secondary | ICD-10-CM | POA: Diagnosis not present

## 2023-07-30 ENCOUNTER — Ambulatory Visit: Payer: Medicaid Other | Admitting: Family Medicine

## 2023-07-30 ENCOUNTER — Telehealth: Payer: Medicaid Other | Admitting: Family Medicine

## 2023-07-30 ENCOUNTER — Encounter: Payer: Self-pay | Admitting: Family Medicine

## 2023-07-30 VITALS — BP 126/84 | HR 63 | Temp 98.1°F | Ht 67.5 in | Wt 171.6 lb

## 2023-07-30 DIAGNOSIS — Z4802 Encounter for removal of sutures: Secondary | ICD-10-CM

## 2023-07-30 DIAGNOSIS — F819 Developmental disorder of scholastic skills, unspecified: Secondary | ICD-10-CM | POA: Diagnosis not present

## 2023-07-30 DIAGNOSIS — F320 Major depressive disorder, single episode, mild: Secondary | ICD-10-CM | POA: Diagnosis not present

## 2023-07-30 DIAGNOSIS — S41112A Laceration without foreign body of left upper arm, initial encounter: Secondary | ICD-10-CM | POA: Diagnosis not present

## 2023-07-30 DIAGNOSIS — F411 Generalized anxiety disorder: Secondary | ICD-10-CM | POA: Diagnosis not present

## 2023-07-30 DIAGNOSIS — F4381 Prolonged grief disorder: Secondary | ICD-10-CM | POA: Diagnosis not present

## 2023-07-30 MED ORDER — HYDROXYZINE HCL 25 MG PO TABS
25.0000 mg | ORAL_TABLET | Freq: Three times a day (TID) | ORAL | 0 refills | Status: AC | PRN
Start: 2023-07-30 — End: 2023-08-29

## 2023-07-30 MED ORDER — LAMOTRIGINE 25 MG PO TABS: 25.0000 mg | ORAL_TABLET | Freq: Every day | ORAL | 0 refills | Status: DC

## 2023-07-30 MED ORDER — SERTRALINE HCL 50 MG PO TABS: 50.0000 mg | ORAL_TABLET | Freq: Every day | ORAL | 0 refills | Status: DC

## 2023-07-30 MED ORDER — RISPERIDONE 1 MG PO TABS: 1.0000 mg | ORAL_TABLET | Freq: Every day | ORAL | 0 refills | Status: DC

## 2023-07-30 NOTE — Progress Notes (Signed)
   Subjective:    Patient ID: Ian [name], male    DOB: [birth year], 23 y.o.   MRN: [id number]  HPI He is here to get his medications refilled until he can be seen by psychiatry on January 23.  He is being seen for treatment of major depressive disorder.  He did have a suicide attempt and was admitted for that.  He did have a wound to his left forearm and stitches were applied.   Review of Systems     Objective:    Physical Exam Alert and in no distress.  His affect appears appropriate.  Four sutures were noted in the left distal forearm       Assessment & Plan:  Depression, major, single episode, mild (HCC) - Plan: hydrOXYzine  (ATARAX ) 25 MG tablet, lamoTRIgine  (LAMICTAL ) 25 MG tablet, risperiDONE  (RISPERDAL ) 1 MG tablet, sertraline  (ZOLOFT ) 50 MG tablet  Laceration of arm, left, initial encounter  Visit for suture removal 4 sutures were removed from the left forearm without difficulty.  He will follow-up with psychiatry and I gave her medications to cover until then.

## 2023-07-31 ENCOUNTER — Other Ambulatory Visit (HOSPITAL_COMMUNITY): Payer: Self-pay

## 2023-07-31 ENCOUNTER — Telehealth: Payer: Self-pay

## 2023-07-31 ENCOUNTER — Telehealth: Payer: Self-pay | Admitting: Family Medicine

## 2023-07-31 NOTE — Telephone Encounter (Signed)
 error

## 2023-07-31 NOTE — Telephone Encounter (Signed)
 P/A already complete. I have spoke with the Pharmacy and the Rx is ready for pickup. ALL 4 of his medications are ready for pickup. I have confirmed with the pts pref'd pharmacy. Pharmacy Patient Advocate Encounter   Received notification from [facility] that Prior Authorization for Risperdal  has been APPROVED from 1.10.25 to 1.10.26    PA #/Case ID/Reference #: (Key: [id number])   Cost is 4.00

## 2023-07-31 NOTE — Telephone Encounter (Signed)
 Pharmacy Patient Advocate Encounter  Received notification from [facility] that Prior Authorization for Risperdal  has been APPROVED from 1.10.25 to 1.10.26   PA #/Case ID/Reference #: (Key: [id number])  Cost is 4.00   In the future, please remember to send to the Rx Prior Auth pool to avoid delays in patient care. Thank you.

## 2023-07-31 NOTE — Telephone Encounter (Signed)
 Pt is needing a PA for risperiDONE (RISPERDAL) 1 MG tablet , which has been sent to be worked on but he is asking if their is something he can take/try until the PA is done.

## 2023-07-31 NOTE — Telephone Encounter (Signed)
 Pt is needing a PA on risperiDONE (RISPERDAL) 1 MG tablet.

## 2023-08-04 ENCOUNTER — Inpatient Hospital Stay: Admission: AD | Admit: 2023-08-04 | Payer: Medicaid Other | Source: Intra-hospital | Admitting: Psychiatry

## 2023-08-04 ENCOUNTER — Ambulatory Visit (HOSPITAL_COMMUNITY)
Admission: EM | Admit: 2023-08-04 | Discharge: 2023-08-05 | Disposition: A | Discharge status: Other Acute Inpatient Hospital | Payer: Medicaid Other | Attending: Psychiatry | Admitting: Psychiatry

## 2023-08-04 DIAGNOSIS — Z79899 Other long term (current) drug therapy: Secondary | ICD-10-CM | POA: Diagnosis not present

## 2023-08-04 DIAGNOSIS — Z62810 Personal history of physical and sexual abuse in childhood: Secondary | ICD-10-CM | POA: Insufficient documentation

## 2023-08-04 DIAGNOSIS — R45851 Suicidal ideations: Secondary | ICD-10-CM | POA: Diagnosis not present

## 2023-08-04 DIAGNOSIS — F333 Major depressive disorder, recurrent, severe with psychotic symptoms: Secondary | ICD-10-CM

## 2023-08-04 DIAGNOSIS — F332 Major depressive disorder, recurrent severe without psychotic features: Secondary | ICD-10-CM

## 2023-08-04 DIAGNOSIS — F209 Schizophrenia, unspecified: Secondary | ICD-10-CM | POA: Diagnosis not present

## 2023-08-04 LAB — CBC WITH DIFFERENTIAL/PLATELET
Abs Immature Granulocytes: 0.03 10*3/uL (ref 0.00–0.07)
Basophils Absolute: 0 10*3/uL (ref 0.0–0.1)
Basophils Relative: 1 %
Eosinophils Absolute: 0.1 10*3/uL (ref 0.0–0.5)
Eosinophils Relative: 1 %
HCT: 48.6 % (ref 39.0–52.0)
Hemoglobin: 15.9 g/dL (ref 13.0–17.0)
Immature Granulocytes: 0 %
Lymphocytes Relative: 18 %
Lymphs Abs: 1.4 10*3/uL (ref 0.7–4.0)
MCH: 28.4 pg (ref 26.0–34.0)
MCHC: 32.7 g/dL (ref 30.0–36.0)
MCV: 86.9 fL (ref 80.0–100.0)
Monocytes Absolute: 0.3 10*3/uL (ref 0.1–1.0)
Monocytes Relative: 4 %
Neutro Abs: 5.9 10*3/uL (ref 1.7–7.7)
Neutrophils Relative %: 76 %
Platelets: 242 10*3/uL (ref 150–400)
RBC: 5.59 MIL/uL (ref 4.22–5.81)
RDW: 13.1 % (ref 11.5–15.5)
WBC: 7.9 10*3/uL (ref 4.0–10.5)
nRBC: 0 % (ref 0.0–0.2)

## 2023-08-04 LAB — ETHANOL: Alcohol, Ethyl (B): <10 mg/dL (ref <10)

## 2023-08-04 LAB — COMPREHENSIVE METABOLIC PANEL
ALT: 19 U/L (ref 0–44)
AST: 22 U/L (ref 15–41)
Albumin: 4.4 g/dL (ref 3.5–5.0)
Alkaline Phosphatase: 65 U/L (ref 38–126)
Anion gap: 13 (ref 5–15)
BUN: 10 mg/dL (ref 6–20)
CO2: 25 mmol/L (ref 22–32)
Calcium: 9.5 mg/dL (ref 8.9–10.3)
Chloride: 100 mmol/L (ref 98–111)
Creatinine, Ser: 1.03 mg/dL (ref 0.61–1.24)
GFR, Estimated: >60 mL/min (ref >60)
Glucose, Bld: 80 mg/dL (ref 70–99)
Potassium: 4.3 mmol/L (ref 3.5–5.1)
Sodium: 138 mmol/L (ref 135–145)
Total Bilirubin: 0.7 mg/dL (ref 0.0–1.2)
Total Protein: 7.2 g/dL (ref 6.5–8.1)

## 2023-08-04 LAB — POCT URINE DRUG SCREEN - MANUAL ENTRY (I-SCREEN)
POC Amphetamine UR: NOT DETECTED
POC Buprenorphine (BUP): NOT DETECTED
POC Cocaine UR: NOT DETECTED
POC Marijuana UR: NOT DETECTED
POC Methadone UR: NOT DETECTED
POC Methamphetamine UR: NOT DETECTED
POC Morphine: NOT DETECTED
POC Oxazepam (BZO): NOT DETECTED
POC Oxycodone UR: NOT DETECTED
POC Secobarbital (BAR): NOT DETECTED

## 2023-08-04 MED ORDER — ALUM & MAG HYDROXIDE-SIMETH 200-200-20 MG/5ML PO SUSP
30.0000 mL | ORAL | Status: DC | PRN
Start: 2023-08-04 — End: 2023-08-05

## 2023-08-04 MED ORDER — MAGNESIUM HYDROXIDE 400 MG/5ML PO SUSP: 30.0000 mL | Freq: Every day | ORAL | Status: DC | PRN

## 2023-08-04 MED ORDER — TRAZODONE HCL 50 MG PO TABS
50.0000 mg | ORAL_TABLET | Freq: Every evening | ORAL | Status: DC | PRN
  Administered 2023-08-04: 50 mg via ORAL
  Filled 2023-08-04: qty 1

## 2023-08-04 MED ORDER — ACETAMINOPHEN 325 MG PO TABS: 650.0000 mg | ORAL_TABLET | Freq: Four times a day (QID) | ORAL | Status: DC | PRN

## 2023-08-04 MED ORDER — RISPERIDONE 1 MG PO TABS
1.0000 mg | ORAL_TABLET | Freq: Every day | ORAL | Status: DC
  Administered 2023-08-04: 1 mg via ORAL
  Filled 2023-08-04: qty 1

## 2023-08-04 MED ORDER — SERTRALINE HCL 50 MG PO TABS
50.0000 mg | ORAL_TABLET | Freq: Every day | ORAL | Status: DC
  Administered 2023-08-05: 50 mg via ORAL
  Filled 2023-08-04: qty 1

## 2023-08-04 MED ORDER — HYDROXYZINE HCL 25 MG PO TABS: 25.0000 mg | ORAL_TABLET | Freq: Three times a day (TID) | ORAL | Status: DC | PRN

## 2023-08-04 MED ORDER — LAMOTRIGINE 25 MG PO TABS
25.0000 mg | ORAL_TABLET | Freq: Every day | ORAL | Status: DC
  Administered 2023-08-05: 25 mg via ORAL
  Filled 2023-08-04: qty 1

## 2023-08-04 NOTE — ED Notes (Signed)
 Patient observed/assessed in bed/chair resting quietly appearing in no distress and verbalizing no complaints at this time. Will continue to monitor.
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
## 2023-08-04 NOTE — Progress Notes (Signed)
 Pt was accepted to [facility] TODAY 08/04/2023; Bed Assignment  L-323 PENDING Signed Voluntary consent uploaded to pt's chart or faxed to [facility] FAX Number [phone] Address: [address] [place], [place], [place] [address]  DX: MDD recurrent severe with psych features.  Per [facility] AC  Please pre admit this patient  Pt meets inpatient criteria per Elveria Mardy PIETY  Attending Physician will be Dr. Charlie Cam HAS  Report can be called to: [phone]  Pt can arrive after: [facility] AC will coordinate with care team.  Care Team notified: Day [facility] AC Danika Riley, RN, Night [facility] Kim Brooks,RN,Raycha Agada,RN, [place] Colman,NP, Zeenat Mamudu,RN, Gregory Sessler,LPN, Lorra Santos,RN, Dawn Lineberry, Randi [place], LCSWA 08/04/2023 @ 10:59 PM

## 2023-08-05 ENCOUNTER — Other Ambulatory Visit: Payer: Self-pay

## 2023-08-05 ENCOUNTER — Inpatient Hospital Stay (HOSPITAL_COMMUNITY)
Admission: AD | Admit: 2023-08-05 | Discharge: 2023-08-10 | DRG: 885 | Disposition: A | Discharge status: Home / Self Care | Payer: Medicaid Other | Source: Intra-hospital | Attending: Psychiatry | Admitting: Psychiatry

## 2023-08-05 ENCOUNTER — Encounter (HOSPITAL_COMMUNITY): Payer: Self-pay | Admitting: Psychiatry

## 2023-08-05 DIAGNOSIS — F902 Attention-deficit hyperactivity disorder, combined type: Secondary | ICD-10-CM | POA: Diagnosis present

## 2023-08-05 DIAGNOSIS — F333 Major depressive disorder, recurrent, severe with psychotic symptoms: Secondary | ICD-10-CM | POA: Diagnosis not present

## 2023-08-05 DIAGNOSIS — J45909 Unspecified asthma, uncomplicated: Secondary | ICD-10-CM | POA: Diagnosis present

## 2023-08-05 DIAGNOSIS — Z823 Family history of stroke: Secondary | ICD-10-CM | POA: Diagnosis not present

## 2023-08-05 DIAGNOSIS — Z23 Encounter for immunization: Secondary | ICD-10-CM | POA: Diagnosis not present

## 2023-08-05 DIAGNOSIS — F419 Anxiety disorder, unspecified: Secondary | ICD-10-CM | POA: Diagnosis present

## 2023-08-05 DIAGNOSIS — F209 Schizophrenia, unspecified: Secondary | ICD-10-CM | POA: Diagnosis present

## 2023-08-05 DIAGNOSIS — Z8249 Family history of ischemic heart disease and other diseases of the circulatory system: Secondary | ICD-10-CM

## 2023-08-05 DIAGNOSIS — F909 Attention-deficit hyperactivity disorder, unspecified type: Secondary | ICD-10-CM | POA: Diagnosis present

## 2023-08-05 DIAGNOSIS — R45851 Suicidal ideations: Secondary | ICD-10-CM | POA: Diagnosis present

## 2023-08-05 DIAGNOSIS — F323 Major depressive disorder, single episode, severe with psychotic features: Secondary | ICD-10-CM | POA: Diagnosis present

## 2023-08-05 DIAGNOSIS — Z79899 Other long term (current) drug therapy: Secondary | ICD-10-CM | POA: Diagnosis not present

## 2023-08-05 DIAGNOSIS — F320 Major depressive disorder, single episode, mild: Secondary | ICD-10-CM

## 2023-08-05 DIAGNOSIS — F329 Major depressive disorder, single episode, unspecified: Principal | ICD-10-CM | POA: Diagnosis present

## 2023-08-05 MED ORDER — ACETAMINOPHEN 325 MG PO TABS: 650.0000 mg | ORAL_TABLET | Freq: Four times a day (QID) | ORAL | Status: DC | PRN

## 2023-08-05 MED ORDER — DIPHENHYDRAMINE HCL 50 MG PO CAPS: 50.0000 mg | ORAL_CAPSULE | Freq: Three times a day (TID) | ORAL | Status: DC | PRN

## 2023-08-05 MED ORDER — HALOPERIDOL 5 MG PO TABS: 5.0000 mg | ORAL_TABLET | Freq: Three times a day (TID) | ORAL | Status: DC | PRN

## 2023-08-05 MED ORDER — HALOPERIDOL LACTATE 5 MG/ML IJ SOLN: 10.0000 mg | Freq: Three times a day (TID) | INTRAMUSCULAR | Status: DC | PRN

## 2023-08-05 MED ORDER — HALOPERIDOL LACTATE 5 MG/ML IJ SOLN: 5.0000 mg | Freq: Three times a day (TID) | INTRAMUSCULAR | Status: DC | PRN

## 2023-08-05 MED ORDER — MAGNESIUM HYDROXIDE 400 MG/5ML PO SUSP: 30.0000 mL | Freq: Every day | ORAL | Status: DC | PRN

## 2023-08-05 MED ORDER — DIPHENHYDRAMINE HCL 50 MG/ML IJ SOLN: 50.0000 mg | Freq: Three times a day (TID) | INTRAMUSCULAR | Status: DC | PRN

## 2023-08-05 MED ORDER — LORAZEPAM 2 MG/ML IJ SOLN: 2.0000 mg | Freq: Three times a day (TID) | INTRAMUSCULAR | Status: DC | PRN

## 2023-08-05 MED ORDER — ALUM & MAG HYDROXIDE-SIMETH 200-200-20 MG/5ML PO SUSP: 30.0000 mL | ORAL | Status: DC | PRN

## 2023-08-05 MED ORDER — TRAZODONE HCL 50 MG PO TABS
50.0000 mg | ORAL_TABLET | Freq: Every evening | ORAL | Status: DC | PRN
  Administered 2023-08-05 – 2023-08-09 (×5): 50 mg via ORAL
  Filled 2023-08-05 (×5): qty 1

## 2023-08-05 MED ORDER — INFLUENZA VIRUS VACC SPLIT PF (FLUZONE) 0.5 ML IM SUSY
0.5000 mL | PREFILLED_SYRINGE | INTRAMUSCULAR | Status: AC
  Administered 2023-08-06: 0.5 mL via INTRAMUSCULAR
  Filled 2023-08-05: qty 0.5

## 2023-08-05 MED ORDER — DIPHENHYDRAMINE HCL 25 MG PO CAPS: 50.0000 mg | ORAL_CAPSULE | Freq: Three times a day (TID) | ORAL | Status: DC | PRN

## 2023-08-05 NOTE — Progress Notes (Signed)
   08/05/23 2030  Psych Admission Type (Psych Patients Only)  Admission Status Voluntary  Psychosocial Assessment  Patient Complaints Anxiety;Depression;Worrying;Suspiciousness  Eye Contact Fair  Facial Expression Anxious;Sad  Affect Anxious;Depressed  Speech Logical/coherent  Interaction Assertive  Motor Activity Slow  Appearance/Hygiene Unremarkable  Behavior Characteristics Cooperative;Guarded  Mood Pleasant;Preoccupied  Aggressive Behavior  Effect No apparent injury  Thought Process  Coherency WDL  Content WDL  Delusions None reported or observed  Perception WDL  Hallucination None reported or observed  Judgment WDL  Confusion WDL  Danger to Self  Current suicidal ideation? Passive  Self-Injurious Behavior Some self-injurious ideation observed or expressed.  No lethal plan expressed   Agreement Not to Harm Self Yes  Description of Agreement verbal contract for safety  Danger to Others  Danger to Others None reported or observed

## 2023-08-05 NOTE — ED Notes (Signed)
 Patient alert & oriented x4. Denies intent to harm self or others in the current moment when asked, although patient does report thoughts to harm self yesterday. Denies A/VH. Patient denies any physical complaints when asked. No acute distress noted. Support and encouragement provided. Writer spoke with patient regarding possible placement in another facility to continue care. Patient reported that had been discussed already but did not want to sign a voluntary consent at this time due to thinking they "may not be here that long". Patient in agreeance to sign voluntary consent at a later time. Routine safety checks conducted per facility protocol. Encouraged patient to notify staff if any thoughts of harm towards self or others arise. Patient verbalizes understanding and agreement.
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
## 2023-08-05 NOTE — ED Notes (Signed)
 Patient resting in lounger with eyes closed, respirations even and unlabored. Patient in no apparent acute distress. Environment secured. Safety checks in place per facility protocol.

## 2023-08-05 NOTE — Plan of Care (Signed)
   Problem: Education: Goal: Knowledge of [facility] Education information/materials will improve Outcome: Progressing Goal: Verbalization of understanding the information provided will improve Outcome: Progressing
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
## 2023-08-05 NOTE — ED Notes (Signed)
 Patient standing at [employer]. Calm, collected, no physical complaints at this time. Patient in no apparent acute distress. Environment secured. Safety checks in place per facility protocol.

## 2023-08-05 NOTE — ED Notes (Signed)
 Pt is currently sleeping, no distress noted, environmental check complete, will continue to monitor patient for safety.
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
## 2023-08-05 NOTE — Progress Notes (Deleted)
   08/05/23 2030  Psych Admission Type (Psych Patients Only)  Admission Status Voluntary  Psychosocial Assessment  Patient Complaints Anxiety;Depression  Eye Contact Fair  Facial Expression Anxious;Sad  Affect Anxious;Depressed  Speech Logical/coherent  Interaction Assertive  Motor Activity Slow  Appearance/Hygiene Unremarkable  Behavior Characteristics Cooperative  Mood Pleasant  Aggressive Behavior  Effect No apparent injury  Thought Process  Coherency WDL  Content WDL  Delusions None reported or observed  Perception WDL  Hallucination None reported or observed  Judgment WDL  Confusion WDL  Danger to Self  Current suicidal ideation? Passive  Self-Injurious Behavior Some self-injurious ideation observed or expressed.  No lethal plan expressed   Agreement Not to Harm Self Yes  Description of Agreement verbal contract for safety  Danger to Others  Danger to Others None reported or observed

## 2023-08-05 NOTE — ED Notes (Signed)
 Patient transferred to [facility] per MD order. Admission reviewed with patient and all questions fully answered. Patient discharged in no acute distress, A& O x4 and ambulatory. Patient denied SI/HI, A/VH upon discharge. Patient mood fair.  Patient belongings returned to patient from locker # 5 complete and intact. Patient escorted to back sallyport via staff for transport to destination. Safety maintained.

## 2023-08-05 NOTE — Progress Notes (Addendum)
 Pt has been accepted to [facility] on 08/05/2023 Bed assignment: 306-1  Pt meets inpatient criteria per: Asuncion Layer NP   Attending Physician will be: Dr. Alver Jobs, MD   Report can be called to: Adult unit: [phone]  Pt can arrive after DC  Care Team Notified: [facility] Kathryn Parish RN, Randi McCombs RN, [place] Ward RN, Asuncion Layer NP  [place] Zeniya Lapidus LCSW-A   08/05/2023 10:14 AM

## 2023-08-05 NOTE — Plan of Care (Signed)
  Problem: Education: Goal: Emotional status will improve Outcome: Progressing Goal: Mental status will improve Outcome: Progressing   Problem: Activity: Goal: Sleeping patterns will improve Outcome: Progressing   Problem: Coping: Goal: Will verbalize feelings Outcome: Progressing   Problem: Coping: Goal: Coping ability will improve Outcome: Not Progressing   Problem: Self-Concept: Goal: Level of anxiety will decrease Outcome: Not Progressing

## 2023-08-05 NOTE — ED Notes (Signed)
 Patient observed/assessed in bed/chair resting quietly appearing in no distress and verbalizing no complaints at this time. Will continue to monitor.

## 2023-08-05 NOTE — BHH Group Notes (Signed)
 Spiritual care group facilitated by Chaplain Nick Barman, [facility]  Group focused on topic of strength. Group members reflected on what thoughts and feelings emerge when they hear this topic. They then engaged in facilitated dialog around how strength is present in their lives. This dialog focused on representing what strength had been to them in their lives (images and patterns given) and what they saw as helpful in their life now (what they needed / wanted).  Activity drew on narrative framework.  Patient Progress: Ian [name] attended the last part of group.  He participated and engaged in group conversation and activities. Aaron Aas

## 2023-08-06 DIAGNOSIS — F333 Major depressive disorder, recurrent, severe with psychotic symptoms: Secondary | ICD-10-CM | POA: Diagnosis not present

## 2023-08-06 MED ORDER — RISPERIDONE 1 MG PO TABS
1.0000 mg | ORAL_TABLET | Freq: Every day | ORAL | Status: DC
  Administered 2023-08-06 – 2023-08-09 (×4): 1 mg via ORAL
  Filled 2023-08-06 (×5): qty 1

## 2023-08-06 MED ORDER — HYDROXYZINE HCL 25 MG PO TABS: 25.0000 mg | ORAL_TABLET | Freq: Three times a day (TID) | ORAL | Status: DC | PRN

## 2023-08-06 MED ORDER — SERTRALINE HCL 50 MG PO TABS
50.0000 mg | ORAL_TABLET | Freq: Every day | ORAL | Status: DC
  Administered 2023-08-06 – 2023-08-10 (×5): 50 mg via ORAL
  Filled 2023-08-06 (×7): qty 1

## 2023-08-06 MED ORDER — RISPERIDONE 1 MG PO TABS: 1.0000 mg | ORAL_TABLET | Freq: Every day | ORAL | Status: DC

## 2023-08-06 MED ORDER — LAMOTRIGINE 25 MG PO TABS: 25.0000 mg | ORAL_TABLET | Freq: Every day | ORAL | Status: DC

## 2023-08-06 MED ORDER — HYDROXYZINE HCL 25 MG PO TABS
25.0000 mg | ORAL_TABLET | Freq: Three times a day (TID) | ORAL | Status: DC | PRN
  Administered 2023-08-06: 25 mg via ORAL
  Filled 2023-08-06: qty 1

## 2023-08-06 NOTE — Progress Notes (Signed)
DAR NOTE: Patient presents with a flat affect and depressed mood.  Reports passive SI  but contracts for safety.  Rates depression at 5, hopelessness at 5, and anxiety at 5.  Maintained on routine safety checks.  Medications given as prescribed.  Support and encouragement offered as needed.  Attended group and participated.  States goal for today is "to stay positive and not pose as a threat to myself."  Patient visible in the dayroom interacting with peers.  Offered no complaint.

## 2023-08-06 NOTE — BHH Group Notes (Signed)
BHH Group Notes:  (Nursing/MHT/Case Management/Adjunct)  Date:  08/06/2023  Time:  9:09 PM  Type of Therapy:   Wrap-up group  Participation Level:  Active  Participation Quality:  Appropriate  Affect:  Appropriate  Cognitive:  Appropriate  Insight:  Appropriate  Engagement in Group:  Engaged  Modes of Intervention:  Education  Summary of Progress/Problems:Goal stay positive. Met goal. Rated day 10/10.  Ian [name] 08/06/2023, 9:09 PM

## 2023-08-06 NOTE — Group Note (Signed)
Date:  08/06/2023 Time:  2:09 PM  Group Topic/Focus:  Goals Group:   The focus of this group is to help patients establish daily goals to achieve during treatment and discuss how the patient can incorporate goal setting into their daily lives to aide in recovery. Orientation:   The focus of this group is to educate the patient on the purpose and policies of crisis stabilization and provide a format to answer questions about their admission.  The group details unit policies and expectations of patients while admitted.    Participation Level:  Active  Participation Quality:  Appropriate and Sharing  Affect:  Appropriate  Cognitive:  Appropriate  Insight: Appropriate  Engagement in Group:  Engaged  Modes of Intervention:  Activity, Discussion, and Orientation  Additional Comments:   Pt attended and participated in the Orientation/Goals group. Pt completed the goals worksheet. Pt shared his personal goals; attending more groups, staying positive and eating healthier.  Edmund Hilda Isac Lincks 08/06/2023, 2:09 PM

## 2023-08-06 NOTE — Plan of Care (Signed)
  Problem: Education: Goal: Emotional status will improve Outcome: Not Progressing Goal: Mental status will improve Outcome: Not Progressing   Problem: Activity: Goal: Interest or engagement in activities will improve Outcome: Not Progressing   
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
## 2023-08-06 NOTE — Group Note (Signed)
Date:  08/06/2023 Time:  5:18 PM  Group Topic/Focus:  Nutrition Group    Participation Level:  Active  Participation Quality:  Appropriate  Affect:  Appropriate  Cognitive:  Appropriate  Insight: Appropriate  Engagement in Group:  Engaged  Modes of Intervention:  Education  Additional Comments:   Pt attended the Nutrition group.  Ian [name] 08/06/2023, 5:18 PM

## 2023-08-06 NOTE — Group Note (Signed)
LCSW Group Therapy Note   Group Date: 08/06/2023 Start Time: 1100 End Time: 1200  Type of Therapy and Topic:  Group Therapy - Who Am I?  Participation Level:  PT DID NOT ATTEND  Description of Group The focus of this group was to aid patients in self-exploration and awareness. Patients were guided in exploring various factors of oneself to include interests, readiness to change, management of emotions, and individual perception of self. Patients were provided with complementary worksheets exploring hidden talents, ease of asking other for help, music/media preferences, understanding and responding to feelings/emotions, and hope for the future. At group closing, patients were encouraged to adhere to discharge plan to assist in continued self-exploration and understanding.   Therapeutic Modalities Cognitive Behavioral Therapy Motivational Interviewing  Kathrynn Humble 08/06/2023  6:26 PM
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
## 2023-08-06 NOTE — [facility] Suicide Risk Assessment (Signed)
Suicide Risk Assessment  Admission Assessment    [facility] Admission Suicide Risk Assessment   Nursing information obtained from:  Patient  Demographic factors:  Male  Current Mental Status:  NA  Loss Factors:  NA  Historical Factors:  NA  Risk Reduction Factors:  NA  Total Time spent with patient: 1.5 hours  Principal Problem: Severe recurrent major depressive disorder with psychotic features (HCC)  Diagnosis:  Principal Problem:   Severe recurrent major depressive disorder with psychotic features (HCC) Active Problems:   ADHD (attention deficit hyperactivity disorder), combined type   MDD (major depressive disorder)  Subjective Data: See H&P.  Continued Clinical Symptoms:  Alcohol Use Disorder Identification Test Final Score (AUDIT): 0 The "Alcohol Use Disorders Identification Test", Guidelines for Use in Primary Care, Second Edition.  World Science writer [facility]). Score between 0-7:  no or low risk or alcohol related problems. Score between 8-15:  moderate risk of alcohol related problems. Score between 16-19:  high risk of alcohol related problems. Score 20 or above:  warrants further diagnostic evaluation for alcohol dependence and treatment.  CLINICAL FACTORS:   Severe Anxiety and/or Agitation Depression:   Impulsivity More than one psychiatric diagnosis Previous Psychiatric Diagnoses and Treatments  Musculoskeletal: Strength & Muscle Tone: within normal limits Gait & Station: normal Patient leans: N/A  Psychiatric Specialty Exam:  Presentation  General Appearance:  Casual; Fairly Groomed  Eye Contact: Fair  Speech: Clear and Coherent; Pressured  Speech Volume: Increased  Handedness: Right   Mood and Affect  Mood: Depressed; Anxious  Affect: Restricted   Thought Process  Thought Processes: Coherent; Goal Directed  Descriptions of Associations:Circumstantial  Orientation:Full (Time, Place and Person)  Thought  Content:Logical  History of Schizophrenia/Schizoaffective disorder:No  Duration of Psychotic Symptoms:No data recorded Hallucinations:Hallucinations: Auditory; Tactile Description of Auditory Hallucinations: "My deceased father talks to me". Description of Visual Hallucinations: Tactile: "I felt a presence hovering over me last night"  Ideas of Reference:None  Suicidal Thoughts:Suicidal Thoughts: Yes, Passive SI Active Intent and/or Plan: With Plan SI Passive Intent and/or Plan: Without Intent; Without Plan; Without Means to Carry Out; Without Access to Means  Homicidal Thoughts:Homicidal Thoughts: No   Sensorium  Memory: Immediate Good; Recent Good; Remote Good  Judgment: Fair  Insight: Good   Executive Functions  Concentration: Fair  Attention Span: Fair  Recall: Good  Fund of Knowledge: Fair  Language: Good   Psychomotor Activity  Psychomotor Activity: Psychomotor Activity: Normal   Assets  Assets: Communication Skills; Desire for Improvement; Financial Resources/Insurance; Housing; Physical Health; Resilience; Social Support   Sleep  Sleep: Sleep: Good Number of Hours of Sleep: 7.5   Physical Exam: Blood pressure 133/71, pulse 82, temperature 98.4 F (36.9 C), temperature source Oral, resp. rate 16, height 5\' 7"  (1.702 m), weight 74.8 kg, SpO2 100%. Body mass index is 25.84 kg/m.  COGNITIVE FEATURES THAT CONTRIBUTE TO RISK:  Closed-mindedness, Polarized thinking, and Thought constriction (tunnel vision)    SUICIDE RISK:   Moderate:  Frequent suicidal ideation with limited intensity, and duration, some specificity in terms of plans, no associated intent, good self-control, limited dysphoria/symptomatology, some risk factors present, and identifiable protective factors, including available and accessible social support.  PLAN OF CARE: See H&P.  I certify that inpatient services furnished can reasonably be expected to improve the patient's  condition.   Armandina Stammer, NP, pmhnp, fnp-bc. 08/06/2023, 3:23 PM

## 2023-08-06 NOTE — Progress Notes (Signed)
   08/06/23 2015  Psych Admission Type (Psych Patients Only)  Admission Status Voluntary  Psychosocial Assessment  Patient Complaints Depression  Eye Contact Fair  Facial Expression Anxious;Sad  Affect Anxious;Depressed  Speech Logical/coherent  Interaction Assertive  Motor Activity Slow  Appearance/Hygiene Unremarkable  Behavior Characteristics Cooperative  Mood Suspicious;Preoccupied;Pleasant  Aggressive Behavior  Effect No apparent injury  Thought Process  Coherency WDL  Content WDL  Delusions None reported or observed  Perception WDL  Hallucination None reported or observed  Judgment WDL  Confusion WDL  Danger to Self  Current suicidal ideation? Passive  Self-Injurious Behavior Some self-injurious ideation observed or expressed.  No lethal plan expressed   Agreement Not to Harm Self Yes  Description of Agreement verbal contract for safety  Danger to Others  Danger to Others None reported or observed

## 2023-08-06 NOTE — Group Note (Signed)
Date:  08/06/2023 Time:  4:58 PM  Group Topic/Focus:  Dimensions of Wellness:   The focus of this group is to introduce the topic of wellness and discuss the role each dimension of wellness plays in total health. Emotional Education:   The focus of this group is to discuss what feelings/emotions are, and how they are experienced.    Participation Level:  Active  Participation Quality:  Appropriate  Affect:  Appropriate  Cognitive:  Appropriate  Insight: Appropriate  Engagement in Group:  Engaged  Modes of Intervention:  Activity and Education  Additional Comments:   Pt attended and participated in the Emotional Wellness group. Pt was attentive as MHT provided education on emotional wellness; definition, benefits, activities etc. Pt practiced the Chakra Balance meditation with the group.   Ian [name] 08/06/2023, 4:58 PM

## 2023-08-07 ENCOUNTER — Encounter (HOSPITAL_COMMUNITY): Payer: Self-pay

## 2023-08-07 ENCOUNTER — Other Ambulatory Visit: Payer: Self-pay | Admitting: Family Medicine

## 2023-08-07 DIAGNOSIS — F333 Major depressive disorder, recurrent, severe with psychotic symptoms: Secondary | ICD-10-CM

## 2023-08-07 DIAGNOSIS — F320 Major depressive disorder, single episode, mild: Secondary | ICD-10-CM

## 2023-08-07 NOTE — Group Note (Signed)
Date:  08/07/2023 Time:  4:46 PM  Group Topic/Focus:  Dimensions of Wellness:   The focus of this group is to introduce the topic of wellness and discuss the role each dimension of wellness plays in total health.    Participation Level:  Active  Participation Quality:  Appropriate  Affect:  Appropriate  Cognitive:  Appropriate  Insight: Appropriate  Engagement in Group:  Engaged  Modes of Intervention:  Activity and Socialization  Additional Comments:   Pt attended and actively participated in the Social Wellness group activity.  Edmund Hilda Franki Stemen 08/07/2023, 4:46 PM

## 2023-08-07 NOTE — Plan of Care (Signed)
  Problem: Education: Goal: Emotional status will improve Outcome: Progressing Goal: Mental status will improve Outcome: Progressing   

## 2023-08-07 NOTE — Group Note (Signed)
Recreation Therapy Group Note   Group Topic:Leisure Education  Group Date: 08/07/2023 Start Time: 0933 End Time: 1009 Facilitators: Zakyria Metzinger-McCall, LRT,CTRS Location: [address] Dayroom   Group Topic: Leisure Education  Goal Area(s) Addresses:  Patient will identify positive leisure activities.  Patient will identify one positive benefit of participation in leisure activities.   Intervention: Leisure Group Game  Activity: Patient, MHT, and LRT participated in playing a trivia game of Guess the [place]. LRT and patients discussed what leisure is, what examples of leisure activities are, where you can participate in leisure and why leisure is important.   Education:  Leisure Exposure, Pharmacist, community, Discharge Planning  Education Outcome: Acknowledges education/In group clarification offered/Needs additional education   Affect/Mood: Appropriate   Participation Level: Engaged   Participation Quality: Independent   Behavior: Appropriate   Speech/Thought Process: Focused   Insight: Good   Judgement: Good   Modes of Intervention: Competitive Play   Patient Response to Interventions:  Engaged   Education Outcome:  In group clarification offered    Clinical Observations/Individualized Feedback: Pt came in late to group and joined right in with the activity. Pt was bright and attentive. Pt made guesses in trying to get the right answers. Pt was engaged with peers in playing the game.    Plan: Continue to engage patient in RT group sessions 2-3x/week.   Linkin Vizzini-McCall, LRT,CTRS 08/07/2023 12:09 PM

## 2023-08-07 NOTE — Progress Notes (Signed)
   08/07/23 0930  Psych Admission Type (Psych Patients Only)  Admission Status Voluntary  Psychosocial Assessment  Patient Complaints Depression  Eye Contact Fair  Facial Expression Anxious;Sad  Affect Anxious;Depressed  Speech Logical/coherent  Interaction Assertive  Motor Activity Slow  Appearance/Hygiene Unremarkable  Behavior Characteristics Cooperative  Mood Suspicious;Sad;Pleasant  Thought Process  Coherency WDL  Content WDL  Delusions None reported or observed  Perception WDL  Hallucination None reported or observed  Judgment WDL  Confusion WDL  Danger to Self  Current suicidal ideation? Passive  Agreement Not to Harm Self Yes  Description of Agreement Verbal  Danger to Others  Danger to Others None reported or observed

## 2023-08-07 NOTE — Progress Notes (Signed)
Pt visible in the milieu.  Interacting appropriately with staff and peers.  Attending groups.  Pt stated he didn't realize prior to admission how his self-harm thoughts were affecting others but feels he is now  ready to go home.  Pt denied SI, HI and AVH.  Pt continues to be monitored for safety.  Pt safe on unit.

## 2023-08-07 NOTE — Group Note (Signed)
Date:  08/07/2023 Time:  2:03 PM  Group Topic/Focus:  Goals Group:   The focus of this group is to help patients establish daily goals to achieve during treatment and discuss how the patient can incorporate goal setting into their daily lives to aide in recovery. Orientation:   The focus of this group is to educate the patient on the purpose and policies of crisis stabilization and provide a format to answer questions about their admission.  The group details unit policies and expectations of patients while admitted.    Participation Level:  Did Not Attend  Participation Quality:   n/a  Affect:   n/a  Cognitive:   n/a  Insight: None  Engagement in Group:   n/a  Modes of Intervention:   n/a  Additional Comments:   Pt did not attend.  Ian [name] 08/07/2023, 2:03 PM
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
## 2023-08-07 NOTE — BH IP Treatment Plan (Signed)
Interdisciplinary Treatment and Diagnostic Plan Update  08/07/2023 Time of Session: 10:35AM Ian [name] MRN: [id number]  Principal Diagnosis: Severe recurrent major depressive disorder with psychotic features [facility])  Secondary Diagnoses: Principal Problem:   Severe recurrent major depressive disorder with psychotic features (HCC) Active Problems:   ADHD (attention deficit hyperactivity disorder), combined type   MDD (major depressive disorder)   Current Medications:  Current Facility-Administered Medications  Medication Dose Route Frequency Provider Last Rate Last Admin   acetaminophen (TYLENOL) tablet 650 mg  650 mg Oral Q6H PRN [name], [name], NP       alum & mag hydroxide-simeth (MAALOX/MYLANTA) 200-200-20 MG/5ML suspension 30 mL  30 mL Oral Q4H PRN [name], [name], NP       haloperidol (HALDOL) tablet 5 mg  5 mg Oral TID PRN [name], NP       And   diphenhydrAMINE (BENADRYL) capsule 50 mg  50 mg Oral TID PRN [name], NP       haloperidol lactate (HALDOL) injection 5 mg  5 mg Intramuscular TID PRN [name], NP       And   diphenhydrAMINE (BENADRYL) injection 50 mg  50 mg Intramuscular TID PRN [name], NP       And   LORazepam (ATIVAN) injection 2 mg  2 mg Intramuscular TID PRN [name], NP       haloperidol lactate (HALDOL) injection 10 mg  10 mg Intramuscular TID PRN [name], NP       And   diphenhydrAMINE (BENADRYL) injection 50 mg  50 mg Intramuscular TID PRN [name], NP       And   LORazepam (ATIVAN) injection 2 mg  2 mg Intramuscular TID PRN [name], NP       hydrOXYzine (ATARAX) tablet 25 mg  25 mg Oral Q8H PRN [name], NP   25 mg at 08/06/23 2113   magnesium hydroxide (MILK OF MAGNESIA) suspension 30 mL  30 mL Oral Daily PRN [name], NP       risperiDONE (RISPERDAL) tablet 1 mg  1 mg Oral QHS [name], [name], NP   1 mg at 08/06/23 2114   sertraline (ZOLOFT) tablet 50 mg  50 mg Oral Daily Armandina Stammer  I, NP   50 mg at 08/07/23 1019   traZODone (DESYREL) tablet 50 mg  50 mg Oral QHS PRN [name], NP   50 mg at 08/06/23 2113   PTA Medications: Medications Prior to Admission  Medication Sig Dispense Refill Last Dose/Taking   hydrOXYzine (ATARAX) 25 MG tablet Take 1 tablet (25 mg total) by mouth every 8 (eight) hours as needed for anxiety. 40 tablet 0 Past Week   lamoTRIgine (LAMICTAL) 25 MG tablet Take 1 tablet (25 mg total) by mouth daily. 15 tablet 0 Past Week   risperiDONE (RISPERDAL) 1 MG tablet Take 1 tablet (1 mg total) by mouth at bedtime. 15 tablet 0 Past Week   sertraline (ZOLOFT) 50 MG tablet Take 1 tablet (50 mg total) by mouth daily. 15 tablet 0 Past Week    Patient Stressors: Traumatic event    Patient Strengths: Motivation for treatment/growth   Treatment Modalities: Medication Management, Group therapy, Case management,  1 to 1 session with clinician, Psychoeducation, Recreational therapy.   Physician Treatment Plan for Primary Diagnosis: Severe recurrent major depressive disorder with psychotic features (HCC) Long Term Goal(s): Improvement in symptoms so as ready for discharge   Short Term Goals: Ability to identify and develop effective  coping behaviors will improve Ability to maintain clinical measurements within normal limits will improve Compliance with prescribed medications will improve Ability to identify triggers associated with substance abuse/mental health issues will improve Ability to identify changes in lifestyle to reduce recurrence of condition will improve Ability to verbalize feelings will improve Ability to disclose and discuss suicidal ideas Ability to demonstrate self-control will improve  Medication Management: Evaluate patient's response, side effects, and tolerance of medication regimen.  Therapeutic Interventions: 1 to 1 sessions, Unit Group sessions and Medication administration.  Evaluation of Outcomes: Not Progressing  Physician  Treatment Plan for Secondary Diagnosis: Principal Problem:   Severe recurrent major depressive disorder with psychotic features (HCC) Active Problems:   ADHD (attention deficit hyperactivity disorder), combined type   MDD (major depressive disorder)  Long Term Goal(s): Improvement in symptoms so as ready for discharge   Short Term Goals: Ability to identify and develop effective coping behaviors will improve Ability to maintain clinical measurements within normal limits will improve Compliance with prescribed medications will improve Ability to identify triggers associated with substance abuse/mental health issues will improve Ability to identify changes in lifestyle to reduce recurrence of condition will improve Ability to verbalize feelings will improve Ability to disclose and discuss suicidal ideas Ability to demonstrate self-control will improve     Medication Management: Evaluate patient's response, side effects, and tolerance of medication regimen.  Therapeutic Interventions: 1 to 1 sessions, Unit Group sessions and Medication administration.  Evaluation of Outcomes: Not Progressing   RN Treatment Plan for Primary Diagnosis: Severe recurrent major depressive disorder with psychotic features (HCC) Long Term Goal(s): Knowledge of disease and therapeutic regimen to maintain health will improve  Short Term Goals: Ability to remain free from injury will improve, Ability to verbalize frustration and anger appropriately will improve, Ability to demonstrate self-control, Ability to participate in decision making will improve, Ability to verbalize feelings will improve, Ability to disclose and discuss suicidal ideas, Ability to identify and develop effective coping behaviors will improve, and Compliance with prescribed medications will improve  Medication Management: RN will administer medications as ordered by provider, will assess and evaluate patient's response and provide education to  patient for prescribed medication. RN will report any adverse and/or side effects to prescribing provider.  Therapeutic Interventions: 1 on 1 counseling sessions, Psychoeducation, Medication administration, Evaluate responses to treatment, Monitor vital signs and CBGs as ordered, Perform/monitor CIWA, COWS, AIMS and Fall Risk screenings as ordered, Perform wound care treatments as ordered.  Evaluation of Outcomes: Not Progressing   LCSW Treatment Plan for Primary Diagnosis: Severe recurrent major depressive disorder with psychotic features (HCC) Long Term Goal(s): Safe transition to appropriate next level of care at discharge, Engage patient in therapeutic group addressing interpersonal concerns.  Short Term Goals: Engage patient in aftercare planning with referrals and resources, Increase social support, Increase ability to appropriately verbalize feelings, Increase emotional regulation, Facilitate acceptance of mental health diagnosis and concerns, Facilitate patient progression through stages of change regarding substance use diagnoses and concerns, Identify triggers associated with mental health/substance abuse issues, and Increase skills for wellness and recovery  Therapeutic Interventions: Assess for all discharge needs, 1 to 1 time with Social worker, Explore available resources and support systems, Assess for adequacy in community support network, Educate family and significant other(s) on suicide prevention, Complete Psychosocial Assessment, Interpersonal group therapy.  Evaluation of Outcomes: Not Progressing   Progress in Treatment: Attending groups: Yes. Participating in groups: Yes. Taking medication as prescribed: Yes. Toleration medication: Yes. Family/Significant other  contact made: No, will contact:  Danise Edge (mom_) [phone] Patient understands diagnosis: Yes. Discussing patient identified problems/goals with staff: Yes. Medical problems stabilized or resolved:  Yes. Denies suicidal/homicidal ideation: Yes. Issues/concerns per patient self-inventory: No.  New problem(s) identified: No, Describe:  none  New Short Term/Long Term Goal(s): medication stabilization, elimination of SI thoughts, development of comprehensive mental wellness plan.    Patient Goals:  "Change my mindset from negative to positive, process my trauma, and get a consistent therapist when I discharge"  Discharge Plan or Barriers: Patient recently admitted. CSW will continue to follow and assess for appropriate referrals and possible discharge planning.    Reason for Continuation of Hospitalization: Depression Medication stabilization Suicidal ideation  Estimated Length of Stay: 5-7 days  Last 3 [place] Suicide Severity Risk Score: Flowsheet Row [facility] from 08[facility]04/2023 in [facility] Admission (Discharged) from 06/30/2023 in [facility]  C-SSRS RISK CATEGORY High Risk High Risk High Risk       Last PHQ 2/9 Scores:    08/04/2023    6:53 PM 02/09/2023   11:04 AM 07/22/2022    1:17 PM  Depression screen PHQ 2/9  Decreased Interest 2 2 0  Down, Depressed, Hopeless 3 3 0  PHQ - 2 Score 5 5 0  Altered sleeping 3 3   Tired, decreased energy 3 3   Change in appetite 2 2   Feeling bad or failure about yourself  3 3   Trouble concentrating 2 2   Moving slowly or fidgety/restless 2 2   Suicidal thoughts 3 3   PHQ-9 Score 23 23   Difficult doing work/chores Very difficult Not difficult at all     Scribe for Treatment Team: Kathi Der, LCSWA 08/07/2023 11:21 AM

## 2023-08-08 DIAGNOSIS — F333 Major depressive disorder, recurrent, severe with psychotic symptoms: Secondary | ICD-10-CM | POA: Diagnosis not present

## 2023-08-08 MED ORDER — LAMOTRIGINE 25 MG PO TABS
25.0000 mg | ORAL_TABLET | Freq: Every day | ORAL | Status: DC
Start: 2023-08-08 — End: 2023-08-10
  Administered 2023-08-08 – 2023-08-10 (×3): 25 mg via ORAL
  Filled 2023-08-08 (×5): qty 1
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
## 2023-08-08 NOTE — [facility] Group Notes (Signed)
Type of Therapy and Topic:  Group Therapy:  Boundaries  Participation Level:  Did Not Attend   Description of Group:  Patients in this group were introduced to  3 types of healthy boundaries to address the extent to which we allow others into our lives, how we communicate, and where we draw the line when it comes to our personal space, emotions, and needs.  Different types of boundaries were defined and described, and each type was discussed with understanding.  Patients discussed what additional healthy boundaries could be helpful in their recovery and wellness after discharge in order to prevent future hospitalizations.   An emphasis was placed on following up with the discharge plan when they leave the hospital in order to continue becoming healthier and happier.    Therapeutic Goals: 1)  demonstrate and identify the type of unhealthy and healthy boundaries  2)  discuss healthy boundaries and how to address it  3)  identify the patient's current level of healthy boundaries and   4)  elicit communicating healthy boundaries and trust    Summary of Patient Progress:  NA  Therapeutic Modalities:   Psychoeducation Brief Solution-Focused Therapy

## 2023-08-08 NOTE — [facility] Group Notes (Signed)
[facility] Group Notes:  (Nursing)  Date:  08/08/2023  Time:  1400  Type of Therapy:  Psychoeducational Skills  Participation Level:  Active  Participation Quality:  Appropriate and Attentive  Affect:  Appropriate  Cognitive:  Alert and Appropriate  Insight:  Appropriate and Good  Engagement in Group:  Engaged  Modes of Intervention:  Activity, Discussion, Exploration, Rapport Building, Socialization, and Support  Summary of Progress/Problems:  Ian [name] 08/08/2023, 3:33 PM

## 2023-08-08 NOTE — Plan of Care (Signed)
  Problem: Education: Goal: Knowledge of [facility] Education information/materials will improve Outcome: Progressing Goal: Verbalization of understanding the information provided will improve Outcome: Progressing   

## 2023-08-08 NOTE — [facility] Group Notes (Signed)
Adult Psychoeducational Group Note  Date:  08/08/2023 Time:  5:37 AM  Group Topic/Focus:  Wrap-Up Group:   The focus of this group is to help patients review their daily goal of treatment and discuss progress on daily workbooks.  Participation Level:  Active  Participation Quality:  Appropriate  Affect:  Appropriate  Cognitive:  Appropriate  Insight: Appropriate  Engagement in Group:  Engaged  Modes of Intervention:  Discussion  Additional Comments:  Attended AA group.   Joselyn Arrow 08/08/2023, 5:37 AM

## 2023-08-08 NOTE — [facility] Group Notes (Signed)
[facility] Group Notes:  (Nursing/MHT/Case Management/Adjunct)  Date:  08/08/2023  Time:  9:07 AM  Type of Therapy:   Goals  Participation Level:  Active  Participation Quality:  Appropriate  Affect:  Appropriate  Cognitive:  Appropriate  Insight:  Good  Engagement in Group:  Engaged  Modes of Intervention:  Discussion and Orientation  Summary of Progress/Problems: Goal is to discharge  Azalee Course 08/08/2023, 9:07 AM

## 2023-08-08 NOTE — Progress Notes (Signed)
   08/08/23 0700  Psych Admission Type (Psych Patients Only)  Admission Status Voluntary  Psychosocial Assessment  Patient Complaints None  Eye Contact Fair  Facial Expression Sad  Affect Appropriate to circumstance  Speech Logical/coherent  Interaction Assertive  Motor Activity Slow  Appearance/Hygiene Unremarkable  Behavior Characteristics Cooperative  Mood Pleasant  Thought Process  Coherency WDL  Content WDL  Delusions None reported or observed  Perception WDL  Hallucination None reported or observed  Judgment WDL  Confusion None  Danger to Self  Current suicidal ideation? Denies  Danger to Others  Danger to Others None reported or observed

## 2023-08-09 DIAGNOSIS — F333 Major depressive disorder, recurrent, severe with psychotic symptoms: Secondary | ICD-10-CM | POA: Diagnosis not present

## 2023-08-09 NOTE — Progress Notes (Signed)
   08/09/23 0730  Psych Admission Type (Psych Patients Only)  Admission Status Voluntary  Psychosocial Assessment  Patient Complaints None  Eye Contact Fair  Facial Expression Animated  Affect Appropriate to circumstance  Speech Logical/coherent  Interaction Assertive  Motor Activity Other (Comment)  Appearance/Hygiene Unremarkable  Behavior Characteristics Appropriate to situation;Cooperative  Mood Pleasant  Thought Process  Coherency WDL  Content WDL  Delusions None reported or observed  Perception WDL  Hallucination None reported or observed  Judgment WDL  Confusion None  Danger to Self  Current suicidal ideation? Denies  Agreement Not to Harm Self Yes  Description of Agreement Verbal  Danger to Others  Danger to Others None reported or observed

## 2023-08-09 NOTE — Progress Notes (Signed)
   08/09/23 2300  Psych Admission Type (Psych Patients Only)  Admission Status Voluntary  Psychosocial Assessment  Patient Complaints Anxiety (Patient safety/Standard)  Eye Contact Fair  Facial Expression Anxious  Affect Appropriate to circumstance  Speech Logical/coherent  Interaction Assertive  Motor Activity Other (Comment) (WNL)  Appearance/Hygiene Unremarkable  Behavior Characteristics Appropriate to situation;Cooperative  Mood Pleasant  Thought Process  Coherency WDL  Content WDL  Delusions None reported or observed  Perception WDL  Hallucination None reported or observed  Judgment WDL  Confusion None  Danger to Self  Current suicidal ideation? Denies  Description of Suicide Plan None  Self-Injurious Behavior No self-injurious ideation or behavior indicators observed or expressed   Agreement Not to Harm Self Yes  Description of Agreement Verbal Contract for safety  Danger to Others  Danger to Others None reported or observed

## 2023-08-09 NOTE — Progress Notes (Signed)
Pt was pleasant and polite on introduction.  He shared that he called family and worked through some issues.  Pt stated, "I'm going to make some changes and stop drinking and smoking.  I'll work on medication management with my doctor when I leave."  Anxiety was 2/10 and he denied SI/HI/AVH.     08/08/23 2300  Charting Type  Charting Type Shift assessment  Safety Check Verification  Has the RN verified the 15 minute safety check completion? Yes  Neurological  Neuro (WDL) WDL  HEENT  HEENT (WDL) WDL  Respiratory  Respiratory (WDL) WDL  Cardiac  Cardiac (WDL) WDL  Vascular  Vascular (WDL) WDL  Integumentary  Integumentary (WDL) X  Braden Scale (Ages 8 and up)  Sensory Perceptions 4  Moisture 4  Activity 4  Mobility 4  Nutrition 3  Friction and Shear 3  Braden Scale Score 22  Musculoskeletal  Musculoskeletal (WDL) WDL  Gastrointestinal  Gastrointestinal (WDL) WDL  Last BM Date  08/08/23  GU Assessment  Genitourinary (WDL) WDL  Neurological  Level of Consciousness Alert

## 2023-08-09 NOTE — Plan of Care (Signed)
  Problem: Education: Goal: Emotional status will improve Outcome: Progressing Goal: Mental status will improve Outcome: Progressing   

## 2023-08-09 NOTE — Group Note (Signed)
Date:  08/09/2023 Time:  9:48 AM  Group Topic/Focus:  Goals Group:   The focus of this group is to help patients establish daily goals to achieve during treatment and discuss how the patient can incorporate goal setting into their daily lives to aide in recovery. Orientation:   The focus of this group is to educate the patient on the purpose and policies of crisis stabilization and provide a format to answer questions about their admission.  The group details unit policies and expectations of patients while admitted.    Participation Level:  Active  Participation Quality:  Appropriate and Sharing  Affect:  Appropriate  Cognitive:  Appropriate  Insight: Appropriate  Engagement in Group:  Engaged  Modes of Intervention:  Discussion  Additional Comments:    Liam Bossman D Tamula Morrical 08/09/2023, 9:48 AM

## 2023-08-09 NOTE — [facility] Suicide Risk Assessment (Signed)
[facility] INPATIENT:  Family/Significant Other Suicide Prevention Education  Suicide Prevention Education:  Contact Attempts: Ian [name] [phone] has been identified by the patient as the family member/significant other with whom the patient will be residing, and identified as the person(s) who will aid the patient in the event of a mental health crisis.  With written consent from the patient, two attempts were made to provide suicide prevention education, prior to and/or following the patient's discharge.  We were unsuccessful in providing suicide prevention education.  A suicide education pamphlet was given to the patient to share with family/significant other.  Date and time of first attempt:08/09/2023/4:15 pm CSW attempt left vm  Ian [name] Ian [name] 08/09/2023, 4:19 PM

## 2023-08-09 NOTE — Plan of Care (Signed)
°  Problem: Education: °Goal: Emotional status will improve °Outcome: Progressing °Goal: Mental status will improve °Outcome: Progressing °Goal: Verbalization of understanding the information provided will improve °Outcome: Progressing °  °
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
## 2023-08-09 NOTE — Group Note (Signed)
Date:  08/09/2023 Time:  2:12 AM  Group Topic/Focus:  Wrap-Up Group:   The focus of this group is to help patients review their daily goal of treatment and discuss progress on daily workbooks.    Participation Level:  Active  Participation Quality:  Appropriate and Sharing  Affect:  Appropriate  Cognitive:  Appropriate  Insight: Appropriate  Engagement in Group:  Engaged  Modes of Intervention:  Activity and Socialization  Additional Comments:  Patients used wrap up group sheets as guide when sharing. Patient rated his day a 10/10. Patient shared his goal for today was "being proactive", and shared that he did achieve his goal. Patient shared coping skills that he finds most helpful is "talking, relating and breathing". Patient shared something that he likes about himself is that his is "caring and compassionate". Patient participated in group activity after sharing.   Ian [name] 08/09/2023, 2:12 AM

## 2023-08-09 NOTE — Plan of Care (Signed)
  Problem: Education: Goal: Knowledge of [facility] Education information/materials will improve Outcome: Progressing Goal: Emotional status will improve Outcome: Progressing Goal: Mental status will improve Outcome: Progressing Goal: Verbalization of understanding the information provided will improve Outcome: Progressing   Problem: Activity: Goal: Interest or engagement in activities will improve Outcome: Progressing   Problem: Coping: Goal: Ability to verbalize frustrations and anger appropriately will improve Outcome: Progressing Goal: Ability to demonstrate self-control will improve Outcome: Progressing

## 2023-08-09 NOTE — [facility] Group Notes (Signed)
[facility] Group Notes:  (Nursing/MHT/Case Management/Adjunct)  Date:  08/09/2023  Time:  2000  Type of Therapy:   Wrap up group  Participation Level:  {BHH PARTICIPATION UEAVW:09811}  Participation Quality:  {BHH PARTICIPATION QUALITY:22265}  Affect:  {BHH AFFECT:22266}  Cognitive:  {BHH COGNITIVE:22267}  Insight:  {BHH Insight2:20797}  Engagement in Group:  {BHH ENGAGEMENT IN BJYNW:29562}  Modes of Intervention:  {BHH MODES OF INTERVENTION:22269}  Summary of Progress/Problems:  Ian [name] 08/09/2023, 11:51 PM

## 2023-08-10 ENCOUNTER — Encounter (HOSPITAL_COMMUNITY): Payer: Self-pay | Admitting: Psychiatry

## 2023-08-10 DIAGNOSIS — F333 Major depressive disorder, recurrent, severe with psychotic symptoms: Secondary | ICD-10-CM | POA: Diagnosis not present

## 2023-08-10 MED ORDER — TRAZODONE HCL 50 MG PO TABS: 50.0000 mg | ORAL_TABLET | Freq: Every evening | ORAL | 0 refills | Status: DC | PRN

## 2023-08-10 MED ORDER — LAMOTRIGINE 25 MG PO TABS: 25.0000 mg | ORAL_TABLET | Freq: Every day | ORAL | 0 refills | Status: DC

## 2023-08-10 MED ORDER — SERTRALINE HCL 50 MG PO TABS: 50.0000 mg | ORAL_TABLET | Freq: Every day | ORAL | 0 refills | Status: DC

## 2023-08-10 MED ORDER — RISPERIDONE 1 MG PO TABS: 1.0000 mg | ORAL_TABLET | Freq: Every day | ORAL | 0 refills | Status: DC

## 2023-08-10 NOTE — [facility] Suicide Risk Assessment (Signed)
[facility] INPATIENT:  Family/Significant Other Suicide Prevention Education  Suicide Prevention Education:  Education Completed; Ian [name] [phone] has been identified by the patient as the family member/significant other with whom the patient will be residing, and identified as the person(s) who will aid the patient in the event of a mental health crisis (suicidal ideations/suicide attempt).  With written consent from the patient, the family member/significant other has been provided the following suicide prevention education, prior to the and/or following the discharge of the patient.  The suicide prevention education provided includes the following: Suicide risk factors Suicide prevention and interventions National Suicide Hotline telephone number [facility] assessment telephone number [facility] Emergency Assistance 911 [facility] and/or Residential Mobile Crisis Unit telephone number  Request made of family/significant other to: Remove weapons (e.g., guns, rifles, knives), all items previously/currently identified as safety concern.   Remove drugs/medications (over-the-counter, prescriptions, illicit drugs), all items previously/currently identified as a safety concern.     CSW spoke to mom and stated that she has no way of coming to get him.  The family member/significant other verbalizes understanding of the suicide prevention education information provided.  The family member/significant other agrees to remove the items of safety concern listed above.  Ian [name] Ian [name] 08/10/2023, 11:04 AM

## 2023-08-10 NOTE — Group Note (Signed)
Recreation Therapy Group Note   Group Topic:Communication  Group Date: 08/10/2023 Start Time: 0933 End Time: 0955 Facilitators: Blimi Godby-McCall, LRT,CTRS Location: [address] Day[address]   Group Topic: Communication, Problem Solving   Goal Area(s) Addresses:  Patient will effectively listen to complete activity.  Patient will identify communication skills used to make activity successful.  Patient will identify how skills used during activity can be used to reach post d/c goals.    Intervention: Building surveyor Activity - Geometric pattern cards, pencils, blank paper    Activity: Geometric Drawings.  Three volunteers from the peer group will be shown an abstract picture with a particular arrangement of geometrical shapes.  Each round, one 'speaker' will describe the pattern, as accurately as possible without revealing the image to the group.  The remaining group members will listen and draw the picture to reflect how it is described to them. Patients with the role of 'listener' cannot ask clarifying questions but, may request that the speaker repeat a direction. Once the drawings are complete, the presenter will show the rest of the group the picture and compare how close each person came to drawing the picture. LRT will facilitate a post-activity discussion regarding effective communication and the importance of planning, listening, and asking for clarification in daily interactions with others.  Education: Environmental consultant, Active listening, Support systems, Discharge planning  Education Outcome: Acknowledges understanding/In group clarification offered/Needs additional education.    Affect/Mood: N/A   Participation Level: Did not attend    Clinical Observations/Individualized Feedback:     Plan: Continue to engage patient in RT group sessions 2-3x/week.   Sherif Millspaugh-McCall, LRT,CTRS  08/10/2023 11:56 AM

## 2023-08-10 NOTE — Progress Notes (Signed)
  [facility] Adult Case Management Discharge Plan :  Will you be returning to the same living situation after discharge:  Yes,  patient will be going home to live with mom At discharge, do you have transportation home?: Yes,  Patient will be going on by Taxi Do you have the ability to pay for your medications:Yes, patient has insurance  Release of information consent forms completed and in the chart;  Patient's signature needed at discharge.  Patient to Follow up at:  Follow-up Information     [facility]. Go on 08/13/2023.   Specialty: Behavioral Health Why: You have an appointments for therapy services on 08/13/23 at 11:00 am, and on 08/13/23 at 2:00 pm for medication management services. Contact information: [address] [place] [address] [phone]                 Next level of care provider has access to [facility] Link:no  Safety Planning and Suicide Prevention discussed: Yes,  CSW spoke to mom     Has patient been referred to the Quitline?: Patient refused referral for treatment  Patient has been referred for addiction treatment: No known substance use disorder.  Shaylee Stanislawski O Tracker Mance, LCSWA 08/10/2023, 11:07 AM

## 2023-08-10 NOTE — Plan of Care (Signed)
?  Problem: Education: ?Goal: Mental status will improve ?Outcome: Progressing ?Goal: Verbalization of understanding the information provided will improve ?Outcome: Progressing ?  ?

## 2023-08-10 NOTE — Discharge Instructions (Signed)

## 2023-08-10 NOTE — Progress Notes (Signed)
Collateral contact - attempt:  Tallis Kemna (mom), [phone]  CSW left a voicemail.   Zuzu Befort, LCSWA 08/10/2023, 8:55 AM

## 2023-08-10 NOTE — Plan of Care (Signed)
  Problem: Education: Goal: Knowledge of [facility] Education information/materials will improve Outcome: Adequate for Discharge Goal: Emotional status will improve Outcome: Adequate for Discharge Goal: Mental status will improve 08/10/2023 1122 by Thersa Salt, RN Outcome: Adequate for Discharge 08/10/2023 9811 by Thersa Salt, RN Outcome: Progressing Goal: Verbalization of understanding the information provided will improve 08/10/2023 1122 by Thersa Salt, RN Outcome: Adequate for Discharge 08/10/2023 9147 by Thersa Salt, RN Outcome: Progressing
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
## 2023-08-10 NOTE — Progress Notes (Signed)
   08/10/23 0800  Psych Admission Type (Psych Patients Only)  Admission Status Voluntary  Psychosocial Assessment  Patient Complaints Anxiety  Eye Contact Fair  Facial Expression Anxious  Affect Appropriate to circumstance  Speech Logical/coherent  Interaction Assertive  Motor Activity Other (Comment) (WDL)  Appearance/Hygiene Unremarkable  Behavior Characteristics Cooperative;Appropriate to situation  Mood Pleasant  Thought Process  Coherency WDL  Content WDL  Delusions None reported or observed  Perception WDL  Hallucination None reported or observed  Judgment WDL  Confusion None  Danger to Self  Current suicidal ideation? Denies  Agreement Not to Harm Self Yes  Description of Agreement Verbal  Danger to Others  Danger to Others None reported or observed
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
## 2023-08-10 NOTE — Progress Notes (Signed)
Discharge instructions, appointments medications and follow ups reviewed with patient, pt verbalized understanding. All belongings returned to patient, patient discharged home via taxi.

## 2023-08-13 DIAGNOSIS — F251 Schizoaffective disorder, depressive type: Secondary | ICD-10-CM | POA: Diagnosis not present

## 2023-08-13 DIAGNOSIS — F4381 Prolonged grief disorder: Secondary | ICD-10-CM | POA: Diagnosis not present

## 2023-08-13 DIAGNOSIS — F411 Generalized anxiety disorder: Secondary | ICD-10-CM | POA: Diagnosis not present

## 2023-08-13 DIAGNOSIS — F819 Developmental disorder of scholastic skills, unspecified: Secondary | ICD-10-CM | POA: Diagnosis not present

## 2023-08-18 ENCOUNTER — Telehealth: Payer: Self-pay | Admitting: Family Medicine

## 2023-08-18 NOTE — Telephone Encounter (Signed)
Pt called and states he started a new medicine a few weeks ago and he is wondering when he should come in for blood work to check and see if it is working good for him.

## 2023-08-27 DIAGNOSIS — F4381 Prolonged grief disorder: Secondary | ICD-10-CM | POA: Diagnosis not present

## 2023-08-27 DIAGNOSIS — F819 Developmental disorder of scholastic skills, unspecified: Secondary | ICD-10-CM | POA: Diagnosis not present

## 2023-08-27 DIAGNOSIS — F411 Generalized anxiety disorder: Secondary | ICD-10-CM | POA: Diagnosis not present

## 2023-09-06 ENCOUNTER — Ambulatory Visit (HOSPITAL_COMMUNITY)
Admission: EM | Admit: 2023-09-06 | Discharge: 2023-09-06 | Discharge status: Against Medical Advice | Payer: Medicaid Other | Attending: Psychiatry | Admitting: Psychiatry

## 2023-09-06 ENCOUNTER — Other Ambulatory Visit: Payer: Self-pay

## 2023-09-06 ENCOUNTER — Encounter (HOSPITAL_COMMUNITY): Payer: Self-pay

## 2023-09-06 ENCOUNTER — Emergency Department (HOSPITAL_COMMUNITY)
Admission: EM | Admit: 2023-09-06 | Discharge: 2023-09-07 | Disposition: A | Discharge status: Other Acute Inpatient Hospital | Payer: Medicaid Other | Attending: Emergency Medicine | Admitting: Emergency Medicine

## 2023-09-06 DIAGNOSIS — R9431 Abnormal electrocardiogram [ECG] [EKG]: Secondary | ICD-10-CM | POA: Diagnosis not present

## 2023-09-06 DIAGNOSIS — R45851 Suicidal ideations: Secondary | ICD-10-CM | POA: Diagnosis not present

## 2023-09-06 DIAGNOSIS — Z23 Encounter for immunization: Secondary | ICD-10-CM | POA: Diagnosis not present

## 2023-09-06 DIAGNOSIS — S90811A Abrasion, right foot, initial encounter: Secondary | ICD-10-CM | POA: Diagnosis not present

## 2023-09-06 DIAGNOSIS — F209 Schizophrenia, unspecified: Secondary | ICD-10-CM | POA: Insufficient documentation

## 2023-09-06 DIAGNOSIS — S80212A Abrasion, left knee, initial encounter: Secondary | ICD-10-CM | POA: Insufficient documentation

## 2023-09-06 DIAGNOSIS — J45909 Unspecified asthma, uncomplicated: Secondary | ICD-10-CM | POA: Insufficient documentation

## 2023-09-06 DIAGNOSIS — S40812A Abrasion of left upper arm, initial encounter: Secondary | ICD-10-CM | POA: Insufficient documentation

## 2023-09-06 DIAGNOSIS — T07XXXA Unspecified multiple injuries, initial encounter: Secondary | ICD-10-CM

## 2023-09-06 DIAGNOSIS — F332 Major depressive disorder, recurrent severe without psychotic features: Secondary | ICD-10-CM | POA: Insufficient documentation

## 2023-09-06 HISTORY — DX: Depression, unspecified: F32.A

## 2023-09-06 LAB — RAPID URINE DRUG SCREEN, HOSP PERFORMED
Amphetamines: NOT DETECTED
Barbiturates: NOT DETECTED
Benzodiazepines: NOT DETECTED
Cocaine: NOT DETECTED
Opiates: NOT DETECTED
Tetrahydrocannabinol: NOT DETECTED

## 2023-09-06 LAB — ETHANOL: Alcohol, Ethyl (B): <10 mg/dL (ref <10)

## 2023-09-06 LAB — CBC
HCT: 44.9 % (ref 39.0–52.0)
Hemoglobin: 14.7 g/dL (ref 13.0–17.0)
MCH: 27.7 pg (ref 26.0–34.0)
MCHC: 32.7 g/dL (ref 30.0–36.0)
MCV: 84.7 fL (ref 80.0–100.0)
Platelets: 258 10*3/uL (ref 150–400)
RBC: 5.3 MIL/uL (ref 4.22–5.81)
RDW: 13.2 % (ref 11.5–15.5)
WBC: 8.9 10*3/uL (ref 4.0–10.5)
nRBC: 0 % (ref 0.0–0.2)

## 2023-09-06 LAB — COMPREHENSIVE METABOLIC PANEL
ALT: 13 U/L (ref 0–44)
AST: 20 U/L (ref 15–41)
Albumin: 4.1 g/dL (ref 3.5–5.0)
Alkaline Phosphatase: 58 U/L (ref 38–126)
Anion gap: 9 (ref 5–15)
BUN: 10 mg/dL (ref 6–20)
CO2: 26 mmol/L (ref 22–32)
Calcium: 9.4 mg/dL (ref 8.9–10.3)
Chloride: 107 mmol/L (ref 98–111)
Creatinine, Ser: 0.97 mg/dL (ref 0.61–1.24)
GFR, Estimated: >60 mL/min (ref >60)
Glucose, Bld: 75 mg/dL (ref 70–99)
Potassium: 4.1 mmol/L (ref 3.5–5.1)
Sodium: 142 mmol/L (ref 135–145)
Total Bilirubin: 0.7 mg/dL (ref 0.0–1.2)
Total Protein: 7.1 g/dL (ref 6.5–8.1)

## 2023-09-06 LAB — ACETAMINOPHEN LEVEL: Acetaminophen (Tylenol), Serum: <10 ug/mL — ABNORMAL LOW (ref 10–30)

## 2023-09-06 LAB — SALICYLATE LEVEL: Salicylate Lvl: <7 mg/dL — ABNORMAL LOW (ref 7.0–30.0)

## 2023-09-06 MED ORDER — SERTRALINE HCL 50 MG PO TABS: 50.0000 mg | ORAL_TABLET | Freq: Every day | ORAL | Status: DC

## 2023-09-06 MED ORDER — LAMOTRIGINE 25 MG PO TABS: 25.0000 mg | ORAL_TABLET | Freq: Every day | ORAL | Status: DC

## 2023-09-06 MED ORDER — RISPERIDONE 1 MG PO TABS
1.0000 mg | ORAL_TABLET | Freq: Every day | ORAL | Status: DC
  Administered 2023-09-06: 1 mg via ORAL
  Filled 2023-09-06: qty 1

## 2023-09-06 MED ORDER — TETANUS-DIPHTH-ACELL PERTUSSIS 5-2.5-18.5 LF-MCG/0.5 IM SUSY
0.5000 mL | PREFILLED_SYRINGE | Freq: Once | INTRAMUSCULAR | Status: AC
  Administered 2023-09-06: 0.5 mL via INTRAMUSCULAR
  Filled 2023-09-06: qty 0.5

## 2023-09-06 MED ORDER — TRAZODONE HCL 50 MG PO TABS: 50.0000 mg | ORAL_TABLET | Freq: Every evening | ORAL | Status: DC | PRN
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
## 2023-09-06 NOTE — ED Notes (Signed)
Attempted to call report to [facility], they are not ready to receive pt and will call me back when they are ready
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
## 2023-09-06 NOTE — ED Notes (Signed)
Left arm wrapped per pt request with telfa and coban
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
## 2023-09-06 NOTE — ED Notes (Signed)
 Assumed pt care.

## 2023-09-06 NOTE — Progress Notes (Signed)
[facility]/BM[facility] LCSW Progress Note   09/06/2023    5:10 PM  CEEJAY KEGLEY   [id number]   Type of Contact and Topic:  Psychiatric Bed Placement   Pt accepted to [facility] Room 303   Patient meets inpatient criteria per Eligha Bridegroom, NP  The attending provider will be Dr. Earmon Phoenix  Call report to [phone]  Denton Ar, RN @ [facility] ED notified.     Pt scheduled  to arrive at [facility] TODAY @ 2000.    Damita Dunnings, MSW, LCSW-A  5:12 PM 09/06/2023

## 2023-09-06 NOTE — ED Notes (Signed)
Safe transport not available for transport until the AM.

## 2023-09-06 NOTE — ED Notes (Signed)
Pt's mother took all belongings.  
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
## 2023-09-07 ENCOUNTER — Encounter: Payer: Self-pay | Admitting: Nurse Practitioner

## 2023-09-07 ENCOUNTER — Inpatient Hospital Stay
Admission: AD | Admit: 2023-09-07 | Discharge: 2023-09-11 | DRG: 885 | Disposition: A | Discharge status: Home / Self Care | Payer: Medicaid Other | Source: Intra-hospital | Attending: Psychiatry | Admitting: Psychiatry

## 2023-09-07 DIAGNOSIS — F411 Generalized anxiety disorder: Secondary | ICD-10-CM | POA: Diagnosis present

## 2023-09-07 DIAGNOSIS — F3162 Bipolar disorder, current episode mixed, moderate: Secondary | ICD-10-CM | POA: Diagnosis not present

## 2023-09-07 DIAGNOSIS — R45851 Suicidal ideations: Secondary | ICD-10-CM | POA: Diagnosis present

## 2023-09-07 DIAGNOSIS — F6381 Intermittent explosive disorder: Secondary | ICD-10-CM | POA: Diagnosis present

## 2023-09-07 DIAGNOSIS — F323 Major depressive disorder, single episode, severe with psychotic features: Secondary | ICD-10-CM | POA: Diagnosis present

## 2023-09-07 DIAGNOSIS — Z8249 Family history of ischemic heart disease and other diseases of the circulatory system: Secondary | ICD-10-CM | POA: Diagnosis not present

## 2023-09-07 DIAGNOSIS — Z823 Family history of stroke: Secondary | ICD-10-CM | POA: Diagnosis not present

## 2023-09-07 DIAGNOSIS — F4310 Post-traumatic stress disorder, unspecified: Secondary | ICD-10-CM | POA: Diagnosis present

## 2023-09-07 DIAGNOSIS — Z809 Family history of malignant neoplasm, unspecified: Secondary | ICD-10-CM

## 2023-09-07 DIAGNOSIS — F329 Major depressive disorder, single episode, unspecified: Principal | ICD-10-CM | POA: Diagnosis present

## 2023-09-07 DIAGNOSIS — Z79899 Other long term (current) drug therapy: Secondary | ICD-10-CM

## 2023-09-07 DIAGNOSIS — F322 Major depressive disorder, single episode, severe without psychotic features: Secondary | ICD-10-CM | POA: Diagnosis not present

## 2023-09-07 DIAGNOSIS — F333 Major depressive disorder, recurrent, severe with psychotic symptoms: Secondary | ICD-10-CM | POA: Diagnosis present

## 2023-09-07 DIAGNOSIS — F909 Attention-deficit hyperactivity disorder, unspecified type: Secondary | ICD-10-CM | POA: Diagnosis present

## 2023-09-07 DIAGNOSIS — F203 Undifferentiated schizophrenia: Secondary | ICD-10-CM | POA: Diagnosis not present

## 2023-09-07 DIAGNOSIS — F209 Schizophrenia, unspecified: Principal | ICD-10-CM | POA: Diagnosis present

## 2023-09-07 MED ORDER — LORAZEPAM 2 MG/ML IJ SOLN: 2.0000 mg | Freq: Three times a day (TID) | INTRAMUSCULAR | Status: DC | PRN

## 2023-09-07 MED ORDER — TRAZODONE HCL 50 MG PO TABS
50.0000 mg | ORAL_TABLET | Freq: Every evening | ORAL | Status: DC | PRN
  Administered 2023-09-07 – 2023-09-10 (×2): 50 mg via ORAL
  Filled 2023-09-07 (×2): qty 1

## 2023-09-07 MED ORDER — HALOPERIDOL LACTATE 5 MG/ML IJ SOLN: 5.0000 mg | Freq: Three times a day (TID) | INTRAMUSCULAR | Status: DC | PRN

## 2023-09-07 MED ORDER — RISPERIDONE 1 MG PO TABS
1.0000 mg | ORAL_TABLET | Freq: Every day | ORAL | Status: DC
  Administered 2023-09-07: 1 mg via ORAL
  Filled 2023-09-07: qty 1

## 2023-09-07 MED ORDER — DIPHENHYDRAMINE HCL 50 MG/ML IJ SOLN: 50.0000 mg | Freq: Three times a day (TID) | INTRAMUSCULAR | Status: DC | PRN

## 2023-09-07 MED ORDER — LAMOTRIGINE 25 MG PO TABS
25.0000 mg | ORAL_TABLET | Freq: Every day | ORAL | Status: DC
  Administered 2023-09-07 – 2023-09-08 (×2): 25 mg via ORAL
  Filled 2023-09-07 (×2): qty 1

## 2023-09-07 MED ORDER — HALOPERIDOL LACTATE 5 MG/ML IJ SOLN: 10.0000 mg | Freq: Three times a day (TID) | INTRAMUSCULAR | Status: DC | PRN

## 2023-09-07 MED ORDER — HALOPERIDOL 5 MG PO TABS: 5.0000 mg | ORAL_TABLET | Freq: Three times a day (TID) | ORAL | Status: DC | PRN

## 2023-09-07 MED ORDER — ACETAMINOPHEN 325 MG PO TABS: 650.0000 mg | ORAL_TABLET | Freq: Four times a day (QID) | ORAL | Status: DC | PRN

## 2023-09-07 MED ORDER — HYDROXYZINE HCL 25 MG PO TABS
25.0000 mg | ORAL_TABLET | Freq: Three times a day (TID) | ORAL | Status: DC | PRN
  Administered 2023-09-07 – 2023-09-10 (×3): 25 mg via ORAL
  Filled 2023-09-07 (×3): qty 1

## 2023-09-07 MED ORDER — SERTRALINE HCL 25 MG PO TABS
50.0000 mg | ORAL_TABLET | Freq: Every day | ORAL | Status: DC
  Administered 2023-09-07 – 2023-09-08 (×2): 50 mg via ORAL
  Filled 2023-09-07 (×2): qty 2

## 2023-09-07 MED ORDER — ALUM & MAG HYDROXIDE-SIMETH 200-200-20 MG/5ML PO SUSP: 30.0000 mL | ORAL | Status: DC | PRN

## 2023-09-07 MED ORDER — MAGNESIUM HYDROXIDE 400 MG/5ML PO SUSP: 30.0000 mL | Freq: Every day | ORAL | Status: DC | PRN

## 2023-09-07 MED ORDER — DIPHENHYDRAMINE HCL 25 MG PO CAPS
50.0000 mg | ORAL_CAPSULE | Freq: Three times a day (TID) | ORAL | Status: DC | PRN
  Administered 2023-09-09: 50 mg via ORAL
  Filled 2023-09-07: qty 2
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
## 2023-09-07 NOTE — ED Notes (Signed)
[facility] transport contacted for transport to [facility]
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
## 2023-09-07 NOTE — Group Note (Signed)
Recreation Therapy Group Note   Group Topic:Coping Skills  Group Date: 09/07/2023 Start Time: 1000 End Time: 1045 Facilitators: Rosina Lowenstein, LRT, CTRS Location:  Craft Room  Group Description: Mind Map.  Patient was provided a blank template of a diagram with 32 blank boxes in a tiered system, branching from the center (similar to a bubble chart). LRT directed patients to label the middle of the diagram "Coping Skills". LRT and patients then came up with 8 different coping skills as examples. Pt were directed to record their coping skills in the 2nd tier boxes closest to the center.  Patients would then share their coping skills with the group as LRT wrote them out. LRT gave a handout of 99 different coping skills at the end of group.   Goal Area(s) Addressed: Patients will be able to define "coping skills". Patient will identify new coping skills.  Patient will increase communication.   Affect/Mood: Appropriate and Flat   Participation Level: Moderate   Participation Quality: Independent   Behavior: Calm and Cooperative   Speech/Thought Process: Coherent   Insight: Fair   Judgement: Fair    Modes of Intervention: Clarification, Education, Guided Discussion, Socialization, and Worksheet   Patient Response to Interventions:  Receptive   Education Outcome:  Acknowledges education   Clinical Observations/Individualized Feedback: Rhian was active in their participation of session activities and group discussion. Pt identified "sleep, writing, and jogging" as coping skills. Pt minimally interacted with LRT and peers while present in group.    Plan: Continue to engage patient in RT group sessions 2-3x/week.   Rosina Lowenstein, LRT, CTRS 09/07/2023 1:49 PM

## 2023-09-07 NOTE — Group Note (Signed)
Date:  09/07/2023 Time:  10:53 PM  Group Topic/Focus:  Self Care:   The focus of this group is to help patients understand the importance of self-care in order to improve or restore emotional, physical, spiritual, interpersonal, and financial health. Wellness Toolbox:   The focus of this group is to discuss various aspects of wellness, balancing those aspects and exploring ways to increase the ability to experience wellness.  Patients will create a wellness toolbox for use upon discharge. Wrap-Up Group:   The focus of this group is to help patients review their daily goal of treatment and discuss progress on daily workbooks.    Participation Level:  Active  Participation Quality:  Appropriate and Attentive  Affect:  Appropriate  Cognitive:  Alert  Insight: Good  Engagement in Group:  Limited  Modes of Intervention:  Discussion, Socialization, and Support  Additional Comments:     Katina Dung 09/07/2023, 10:53 PM

## 2023-09-07 NOTE — ED Notes (Signed)
Report given to RN at [facility]

## 2023-09-07 NOTE — Plan of Care (Signed)
   Problem: Education: Goal: Emotional status will improve Outcome: Progressing Goal: Mental status will improve Outcome: Progressing Goal: Verbalization of understanding the information provided will improve Outcome: Progressing

## 2023-09-07 NOTE — Group Note (Signed)
Date:  09/07/2023 Time:  10:25 AM  Group Topic/Focus:  Diagnosis Education:   The focus of this group is to discuss the major disorders that patients maybe diagnosed with.  Group discusses the importance of knowing what one's diagnosis is so that one can understand treatment and better advocate for oneself. Healthy Communication:   The focus of this group is to discuss communication, barriers to communication, as well as healthy ways to communicate with others.    Participation Level:  Active  Participation Quality:  Appropriate  Affect:  Appropriate  Cognitive:  Alert and Appropriate  Insight: Appropriate  Engagement in Group:  Developing/Improving and Engaged  Modes of Intervention:  Activity, Discussion, and Education  Additional Comments:    Rosaura Carpenter 09/07/2023, 10:25 AM

## 2023-09-07 NOTE — Tx Team (Signed)
Initial Treatment Plan 09/07/2023 6:13 AM Metta Clines [id number]    PATIENT STRESSORS: Loss of relationship      PATIENT STRENGTHS: Motivation for treatment/growth  Supportive family/friends    PATIENT IDENTIFIED PROBLEMS: MDD   Psychosis                    DISCHARGE CRITERIA:  Improved stabilization in mood, thinking, and/or behavior Motivation to continue treatment in a less acute level of care  PRELIMINARY DISCHARGE PLAN: Outpatient therapy  PATIENT/FAMILY INVOLVEMENT: This treatment plan has been presented to and reviewed with the patient, Ian [name],  The patient and family have been given the opportunity to ask questions and make suggestions.  Trula Ore, RN 09/07/2023, 6:13 AM
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
## 2023-09-07 NOTE — Plan of Care (Signed)
   Problem: Education: Goal: Emotional status will improve Outcome: Progressing   Problem: Education: Goal: Mental status will improve Outcome: Progressing

## 2023-09-07 NOTE — BH IP Treatment Plan (Signed)
Interdisciplinary Treatment and Diagnostic Plan Update  09/07/2023 Time of Session: 13:06 Ian [name] MRN: [id number]  Principal Diagnosis: MDD (major depressive disorder)  Secondary Diagnoses: Principal Problem:   MDD (major depressive disorder)   Current Medications:  Current Facility-Administered Medications  Medication Dose Route Frequency Provider Last Rate Last Admin   acetaminophen (TYLENOL) tablet 650 mg  650 mg Oral Q6H PRN Eligha Bridegroom, NP       alum & mag hydroxide-simeth (MAALOX/MYLANTA) 200-200-20 MG/5ML suspension 30 mL  30 mL Oral Q4H PRN Eligha Bridegroom, NP       haloperidol (HALDOL) tablet 5 mg  5 mg Oral TID PRN Eligha Bridegroom, NP       And   diphenhydrAMINE (BENADRYL) capsule 50 mg  50 mg Oral TID PRN Eligha Bridegroom, NP       haloperidol lactate (HALDOL) injection 5 mg  5 mg Intramuscular TID PRN Eligha Bridegroom, NP       And   diphenhydrAMINE (BENADRYL) injection 50 mg  50 mg Intramuscular TID PRN Eligha Bridegroom, NP       And   LORazepam (ATIVAN) injection 2 mg  2 mg Intramuscular TID PRN Eligha Bridegroom, NP       haloperidol lactate (HALDOL) injection 10 mg  10 mg Intramuscular TID PRN Eligha Bridegroom, NP       And   diphenhydrAMINE (BENADRYL) injection 50 mg  50 mg Intramuscular TID PRN Eligha Bridegroom, NP       And   LORazepam (ATIVAN) injection 2 mg  2 mg Intramuscular TID PRN Eligha Bridegroom, NP       hydrOXYzine (ATARAX) tablet 25 mg  25 mg Oral TID PRN Eligha Bridegroom, NP       lamoTRIgine (LAMICTAL) tablet 25 mg  25 mg Oral Daily Eligha Bridegroom, NP   25 mg at 09/07/23 7829   magnesium hydroxide (MILK OF MAGNESIA) suspension 30 mL  30 mL Oral Daily PRN Eligha Bridegroom, NP       risperiDONE (RISPERDAL) tablet 1 mg  1 mg Oral QHS Eligha Bridegroom, NP       sertraline (ZOLOFT) tablet 50 mg  50 mg Oral Daily Eligha Bridegroom, NP   50 mg at 09/07/23 5621   traZODone (DESYREL) tablet 50 mg  50 mg Oral QHS PRN Eligha Bridegroom, NP        PTA Medications: Medications Prior to Admission  Medication Sig Dispense Refill Last Dose/Taking   lamoTRIgine (LAMICTAL) 25 MG tablet Take 1 tablet (25 mg total) by mouth daily for 14 days. 14 tablet 0    risperiDONE (RISPERDAL) 1 MG tablet Take 1 tablet (1 mg total) by mouth at bedtime. 30 tablet 0    sertraline (ZOLOFT) 50 MG tablet Take 1 tablet (50 mg total) by mouth daily. 30 tablet 0    traZODone (DESYREL) 50 MG tablet Take 1 tablet (50 mg total) by mouth at bedtime as needed for up to 10 days for sleep. 10 tablet 0     Patient Stressors: Loss of relationship     Patient Strengths: Motivation for treatment/growth  Supportive family/friends   Treatment Modalities: Medication Management, Group therapy, Case management,  1 to 1 session with clinician, Psychoeducation, Recreational therapy.   Physician Treatment Plan for Primary Diagnosis: MDD (major depressive disorder) Long Term Goal(s):     Short Term Goals:    Medication Management: Evaluate patient's response, side effects, and tolerance of medication regimen.  Therapeutic Interventions: 1 to 1 sessions, Unit Group sessions and Medication  administration.  Evaluation of Outcomes: Progressing  Physician Treatment Plan for Secondary Diagnosis: Principal Problem:   MDD (major depressive disorder)  Long Term Goal(s):     Short Term Goals:       Medication Management: Evaluate patient's response, side effects, and tolerance of medication regimen.  Therapeutic Interventions: 1 to 1 sessions, Unit Group sessions and Medication administration.  Evaluation of Outcomes: Progressing   RN Treatment Plan for Primary Diagnosis: MDD (major depressive disorder) Long Term Goal(s): Knowledge of disease and therapeutic regimen to maintain health will improve  Short Term Goals: Ability to remain free from injury will improve, Ability to verbalize frustration and anger appropriately will improve, Ability to demonstrate  self-control, Ability to participate in decision making will improve, Ability to verbalize feelings will improve, Ability to disclose and discuss suicidal ideas, Ability to identify and develop effective coping behaviors will improve, and Compliance with prescribed medications will improve  Medication Management: RN will administer medications as ordered by provider, will assess and evaluate patient's response and provide education to patient for prescribed medication. RN will report any adverse and/or side effects to prescribing provider.  Therapeutic Interventions: 1 on 1 counseling sessions, Psychoeducation, Medication administration, Evaluate responses to treatment, Monitor vital signs and CBGs as ordered, Perform/monitor CIWA, COWS, AIMS and Fall Risk screenings as ordered, Perform wound care treatments as ordered.  Evaluation of Outcomes: Progressing   LCSW Treatment Plan for Primary Diagnosis: MDD (major depressive disorder) Long Term Goal(s): Safe transition to appropriate next level of care at discharge, Engage patient in therapeutic group addressing interpersonal concerns.  Short Term Goals: Engage patient in aftercare planning with referrals and resources, Increase social support, Increase ability to appropriately verbalize feelings, Increase emotional regulation, Facilitate acceptance of mental health diagnosis and concerns, Identify triggers associated with mental health/substance abuse issues, and Increase skills for wellness and recovery  Therapeutic Interventions: Assess for all discharge needs, 1 to 1 time with Social worker, Explore available resources and support systems, Assess for adequacy in community support network, Educate family and significant other(s) on suicide prevention, Complete Psychosocial Assessment, Interpersonal group therapy.  Evaluation of Outcomes: Progressing   Progress in Treatment: Attending groups: Yes. Participating in groups: Yes. Taking medication  as prescribed: Yes. Toleration medication: Yes. Family/Significant other contact made: No, will contact:  when given permission. Patient understands diagnosis: Yes. Discussing patient identified problems/goals with staff: Yes. Medical problems stabilized or resolved: Yes. Denies suicidal/homicidal ideation: Yes. Issues/concerns per patient self-inventory: No. Other: none.  New problem(s) identified: No, Describe:  none identified.  New Short Term/Long Term Goal(s): medication management for mood stabilization; elimination of SI thoughts; development of comprehensive mental wellness plan.  Patient Goals:  "Recover from the trauma and be a better man by the time I get out."  Discharge Plan or Barriers: CSW will assist pt with development of an appropriate aftercare/discharge plan.   Reason for Continuation of Hospitalization: Anxiety Depression Medication stabilization Suicidal ideation  Estimated Length of Stay: 1-7 days  Last 3 [place] Suicide Severity Risk Score: Flowsheet Row Admission (Current) from 09/07/2023 in [facility] INPATIENT BEHAVIORAL MEDICINE Most recent reading at 09/07/2023  5:38 AM ED from 09/06/2023 in [facility] Emergency Department [facility] Most recent reading at 09/06/2023 12:00 PM ED from 09/06/2023 in [facility] Most recent reading at 09/06/2023 11:20 AM  C-SSRS RISK CATEGORY Low Risk Low Risk Error: Q3, 4, or 5 should not be populated when Q2 is No       Last  PHQ 2/9 Scores:    08/04/2023    6:53 PM 02/09/2023   11:04 AM 07/22/2022    1:17 PM  Depression screen PHQ 2/9  Decreased Interest 2 2 0  Down, Depressed, Hopeless 3 3 0  PHQ - 2 Score 5 5 0  Altered sleeping 3 3   Tired, decreased energy 3 3   Change in appetite 2 2   Feeling bad or failure about yourself  3 3   Trouble concentrating 2 2   Moving slowly or fidgety/restless 2 2   Suicidal thoughts 3 3   PHQ-9 Score 23 23   Difficult doing work/chores Very  difficult Not difficult at all     Scribe for Treatment Team: Glenis Smoker, LCSW 09/07/2023 2:25 PM

## 2023-09-07 NOTE — Progress Notes (Signed)
   09/07/23 0928  Psych Admission Type (Psych Patients Only)  Admission Status Voluntary  Psychosocial Assessment  Patient Complaints Depression;Hopelessness  Eye Contact Fair  Facial Expression Sad  Affect Sad  Speech Soft;Tangential  Interaction Minimal  Motor Activity Slow  Appearance/Hygiene In scrubs  Behavior Characteristics Fidgety  Mood Depressed;Anxious  Thought Process  Coherency WDL  Content WDL  Delusions None reported or observed  Perception WDL  Hallucination None reported or observed  Judgment Poor  Confusion None  Danger to Self  Current suicidal ideation? Denies  Self-Injurious Behavior No self-injurious ideation or behavior indicators observed or expressed   Agreement Not to Harm Self Yes  Description of Agreement Verbal  Danger to Others  Danger to Others None reported or observed

## 2023-09-07 NOTE — Group Note (Signed)
[facility] LCSW Group Therapy Note    Group Date: 09/07/2023 Start Time: 1330 End Time: 1430  Type of Therapy and Topic:  Group Therapy:  Overcoming Obstacles  Participation Level:  [facility] PARTICIPATION LEVEL: None  Mood:  Description of Group:   In this group patients will be encouraged to explore what they see as obstacles to their own wellness and recovery. They will be guided to discuss their thoughts, feelings, and behaviors related to these obstacles. The group will process together ways to cope with barriers, with attention given to specific choices patients can make. Each patient will be challenged to identify changes they are motivated to make in order to overcome their obstacles. This group will be process-oriented, with patients participating in exploration of their own experiences as well as giving and receiving support and challenge from other group members.  Therapeutic Goals: 1. Patient will identify personal and current obstacles as they relate to admission. 2. Patient will identify barriers that currently interfere with their wellness or overcoming obstacles.  3. Patient will identify feelings, thought process and behaviors related to these barriers. 4. Patient will identify two changes they are willing to make to overcome these obstacles:    Summary of Patient Progress   X   Therapeutic Modalities:   Cognitive Behavioral Therapy Solution Focused Therapy Motivational Interviewing Relapse Prevention Therapy   Harden Mo, LCSW

## 2023-09-08 DIAGNOSIS — F4310 Post-traumatic stress disorder, unspecified: Secondary | ICD-10-CM | POA: Insufficient documentation

## 2023-09-08 DIAGNOSIS — F203 Undifferentiated schizophrenia: Secondary | ICD-10-CM | POA: Diagnosis not present

## 2023-09-08 DIAGNOSIS — F6381 Intermittent explosive disorder: Secondary | ICD-10-CM | POA: Insufficient documentation

## 2023-09-08 MED ORDER — RISPERIDONE 1 MG PO TABS
1.0000 mg | ORAL_TABLET | Freq: Two times a day (BID) | ORAL | Status: DC
  Administered 2023-09-08 – 2023-09-11 (×6): 1 mg via ORAL
  Filled 2023-09-08 (×6): qty 1

## 2023-09-08 MED ORDER — LAMOTRIGINE 25 MG PO TABS
50.0000 mg | ORAL_TABLET | Freq: Every day | ORAL | Status: DC
  Administered 2023-09-09 – 2023-09-11 (×3): 50 mg via ORAL
  Filled 2023-09-08 (×3): qty 2

## 2023-09-08 MED ORDER — PROPRANOLOL HCL 20 MG PO TABS
10.0000 mg | ORAL_TABLET | Freq: Every day | ORAL | Status: DC
  Administered 2023-09-09 – 2023-09-11 (×3): 10 mg via ORAL
  Filled 2023-09-08 (×3): qty 1

## 2023-09-08 MED ORDER — SERTRALINE HCL 100 MG PO TABS
100.0000 mg | ORAL_TABLET | Freq: Every day | ORAL | Status: DC
  Administered 2023-09-09 – 2023-09-11 (×3): 100 mg via ORAL
  Filled 2023-09-08 (×3): qty 1

## 2023-09-08 NOTE — Group Note (Signed)
Date:  09/08/2023 Time:  3:23 PM  Group Topic/Focus:  Activity Group: The focus of the group is to promote activity for the patients and to encourage them to go outside to the courtyard to get some fresh air and some exercise.    Participation Level:  Did Not Attend   Ian [name] 09/08/2023, 3:23 PM

## 2023-09-08 NOTE — Group Note (Signed)
Date:  09/08/2023 Time:  10:08 AM  Group Topic/Focus:  Dimensions of Wellness:   The focus of this group is to introduce the topic of wellness and discuss the role each dimension of wellness plays in total health. Self Care:   The focus of this group is to help patients understand the importance of self-care in order to improve or restore emotional, physical, spiritual, interpersonal, and financial health.    Participation Level:  Did Not Attend   Ian [name] 09/08/2023, 10:08 AM

## 2023-09-08 NOTE — Progress Notes (Signed)
   09/07/23 2200  Psych Admission Type (Psych Patients Only)  Admission Status Voluntary  Psychosocial Assessment  Patient Complaints Depression;Hopelessness  Eye Contact Fair  Facial Expression Sad  Affect Sad  Speech Soft;Tangential  Interaction Minimal  Motor Activity Slow  Appearance/Hygiene In scrubs  Behavior Characteristics Fidgety  Mood Depressed;Anxious  Thought Process  Coherency WDL  Content WDL  Delusions None reported or observed  Perception WDL  Hallucination None reported or observed  Judgment Poor  Confusion None  Danger to Self  Current suicidal ideation? Denies  Self-Injurious Behavior No self-injurious ideation or behavior indicators observed or expressed   Agreement Not to Harm Self Yes  Description of Agreement VERBAL  Danger to Others  Danger to Others None reported or observed

## 2023-09-08 NOTE — Progress Notes (Signed)
[facility] MD Progress Note  09/08/2023 2:04 PM Ian [name]  MRN:  [id number] Subjective:   23 year old African American male, reports feeling overwhelmed at the time but denies current suicidal or homicidal ideation (SI/HI).Expresses frustration and visible distress, requesting early discharge.Collateral Information (Godmother - Arnette Thompson):Reports patient has history of anger and resentment related to childhood trauma.States patient has made past comments like "I should just kill" in reference to people who anger him.Suspects vaping marijuana or drinking alcohol, but toxicology screen is negative for ETOH and marijuana.Expresses concerns about ongoing anger management issues. Principal Problem: Schizophrenia (HCC) Diagnosis: Principal Problem:   Schizophrenia (HCC) Active Problems:   Severe recurrent major depressive disorder with psychotic features (HCC)   MDD (major depressive disorder)   Intermittent explosive disorder   PTSD (post-traumatic stress disorder)  Total Time spent with patient: 2 hours  Past Psychiatric History: see below  Past Medical History:  Past Medical History:  Diagnosis Date   Acne    ADHD (attention deficit hyperactivity disorder)    Allergy    Asthma    Depression    Functional murmur    Migraines     Past Surgical History:  Procedure Laterality Date   NO PAST SURGERIES  2017   Family History:  Family History  Problem Relation Age of Onset   Hypertension Mother    Cancer Father        died at 40,unsure type, possibly colon   Hypertension Father    Stroke Maternal Grandmother 53   Heart disease Neg Hx    Family Psychiatric  History: none reported Social History:  Social History   Substance and Sexual Activity  Alcohol Use No     Social History   Substance and Sexual Activity  Drug Use No    Social History   Socioeconomic History   Marital status: Single    Spouse name: Not on file   Number of children: Not on file   Years of  education: Not on file   Highest education level: Not on file  Occupational History   Not on file  Tobacco Use   Smoking status: Never   Smokeless tobacco: Never  Substance and Sexual Activity   Alcohol use: No   Drug use: No   Sexual activity: Yes    Birth control/protection: Condom  Other Topics Concern   Not on file  Social History Narrative   Lives with mom and older brother. No pets or tobacco exposure (outside dog).  10th grade, [name]   Social Drivers of Corporate investment banker Strain: Not on file  Food Insecurity: No Food Insecurity (09/07/2023)   Hunger Vital Sign    Worried About Running Out of Food in the Last Year: Never true    Ran Out of Food in the Last Year: Never true  Recent Concern: Food Insecurity - Food Insecurity Present (06/30/2023)   Hunger Vital Sign    Worried About Running Out of Food in the Last Year: Never true    Ran Out of Food in the Last Year: Sometimes true  Transportation Needs: No Transportation Needs (09/07/2023)   PRAPARE - Administrator, Civil Service (Medical): No    Lack of Transportation (Non-Medical): No  Physical Activity: Insufficiently Active (02/09/2023)   Exercise Vital Sign    Days of Exercise per Week: 3 days    Minutes of Exercise per Session: 30 min  Stress: Not on file  Social Connections: Unknown (12/03/2021)   [facility]   Social Network    Social Network: Not on file   Additional Social History:                         Sleep: Fair  Appetite:  Good  Current Medications: Current Facility-Administered Medications  Medication Dose Route Frequency Provider Last Rate Last Admin   acetaminophen (TYLENOL) tablet 650 mg  650 mg Oral Q6H PRN Eligha Bridegroom, NP       alum & mag hydroxide-simeth (MAALOX/MYLANTA) 200-200-20 MG/5ML suspension 30 mL  30 mL Oral Q4H PRN Eligha Bridegroom, NP       haloperidol (HALDOL) tablet 5 mg  5 mg Oral TID PRN Eligha Bridegroom, NP        And   diphenhydrAMINE (BENADRYL) capsule 50 mg  50 mg Oral TID PRN Eligha Bridegroom, NP       haloperidol lactate (HALDOL) injection 5 mg  5 mg Intramuscular TID PRN Eligha Bridegroom, NP       And   diphenhydrAMINE (BENADRYL) injection 50 mg  50 mg Intramuscular TID PRN Eligha Bridegroom, NP       And   LORazepam (ATIVAN) injection 2 mg  2 mg Intramuscular TID PRN Eligha Bridegroom, NP       haloperidol lactate (HALDOL) injection 10 mg  10 mg Intramuscular TID PRN Eligha Bridegroom, NP       And   diphenhydrAMINE (BENADRYL) injection 50 mg  50 mg Intramuscular TID PRN Eligha Bridegroom, NP       And   LORazepam (ATIVAN) injection 2 mg  2 mg Intramuscular TID PRN Eligha Bridegroom, NP       hydrOXYzine (ATARAX) tablet 25 mg  25 mg Oral TID PRN Eligha Bridegroom, NP   25 mg at 09/08/23 1204   [START ON 09/09/2023] lamoTRIgine (LAMICTAL) tablet 50 mg  50 mg Oral Daily Myriam Forehand, NP       magnesium hydroxide (MILK OF MAGNESIA) suspension 30 mL  30 mL Oral Daily PRN Eligha Bridegroom, NP       risperiDONE (RISPERDAL) tablet 1 mg  1 mg Oral BID Myriam Forehand, NP       Melene Muller ON 09/09/2023] sertraline (ZOLOFT) tablet 100 mg  100 mg Oral Daily Myriam Forehand, NP       traZODone (DESYREL) tablet 50 mg  50 mg Oral QHS PRN Eligha Bridegroom, NP   50 mg at 09/07/23 2056    Lab Results:  Results for orders placed or performed during the hospital encounter of 09/06/23 (from the past 48 hours)  Rapid urine drug screen (hospital performed)     Status: None   Collection Time: 09/06/23  3:38 PM  Result Value Ref Range   Opiates NONE DETECTED NONE DETECTED   Cocaine NONE DETECTED NONE DETECTED   Benzodiazepines NONE DETECTED NONE DETECTED   Amphetamines NONE DETECTED NONE DETECTED   Tetrahydrocannabinol NONE DETECTED NONE DETECTED   Barbiturates NONE DETECTED NONE DETECTED    Comment: (NOTE) DRUG SCREEN FOR MEDICAL PURPOSES ONLY.  IF CONFIRMATION IS NEEDED FOR ANY PURPOSE, NOTIFY LAB WITHIN 5  DAYS.  LOWEST DETECTABLE LIMITS FOR URINE DRUG SCREEN Drug Class                     Cutoff (ng/mL) Amphetamine and metabolites    1000 Barbiturate and metabolites    200 Benzodiazepine  200 Opiates and metabolites        300 Cocaine and metabolites        300 THC                            50 Performed at [facility], [address] [place], [place] [address]     Blood Alcohol level:  Lab Results  Component Value Date   ETH <10 09/06/2023   ETH <10 08/04/2023    Metabolic Disorder Labs: Lab Results  Component Value Date   HGBA1C 5.0 06/30/2023   MPG 96.8 06/30/2023   No results found for: "PROLACTIN" Lab Results  Component Value Date   CHOL 133 02/09/2023   TRIG 37 02/09/2023   HDL 53 02/09/2023   CHOLHDL 2.5 02/09/2023   VLDL 9 05/16/2016   LDLCALC 71 02/09/2023   LDLCALC 70 05/16/2016    Physical Findings: AIMS:  , ,  ,  ,    CIWA:    COWS:     Musculoskeletal: Strength & Muscle Tone: within normal limits Gait & Station: normal Patient leans: N/A  Psychiatric Specialty Exam:  Presentation  General Appearance:  Neat; Appropriate for Environment (Visibly upset, requesting early discharge.)  Eye Contact: Good  Speech: Clear and Coherent; Normal Rate  Speech Volume: Normal  Handedness: Right   Mood and Affect  Mood: Anxious; Irritable (Frustrated)  Affect: Constricted   Thought Process  Thought Processes: Linear (but preoccupied with conflict and anger.)  Descriptions of Associations:Intact  Orientation:Full (Time, Place and Person)  Thought Content:WDL (Collateral reports history of anger and past homicidal comments.)  History of Schizophrenia/Schizoaffective disorder:Yes  Duration of Psychotic Symptoms:Less than six months  Hallucinations:Hallucinations: None Description of Auditory Hallucinations: denies Description of Visual Hallucinations: denies  Ideas of Reference:None  Suicidal  Thoughts:Suicidal Thoughts: No SI Active Intent and/or Plan: -- (denies) SI Passive Intent and/or Plan: -- (denies)  Homicidal Thoughts:Homicidal Thoughts: No   Sensorium  Memory: Immediate Good; Recent Good; Remote Good  Judgment: Poor (impulsive actions (jumping on car hood), anger issues.)  Insight: Poor (minimizes emotional response to conflict)   Executive Functions  Concentration: Good  Attention Span: Fair  Recall: FairPeri Jefferson  Fund of Knowledge: Good  Language: Good   Psychomotor Activity  Psychomotor Activity:Psychomotor Activity: Normal   Assets  Assets: Housing; Manufacturing systems engineer; Financial Resources/Insurance   Sleep  Sleep:Sleep: Fair Number of Hours of Sleep: 6    Physical Exam: Physical Exam Vitals and nursing note reviewed.  Constitutional:      Appearance: Normal appearance.  HENT:     Head: Normocephalic and atraumatic.     Nose: Nose normal.  Pulmonary:     Effort: Pulmonary effort is normal.  Musculoskeletal:        General: Normal range of motion.     Cervical back: Normal range of motion.  Neurological:     General: No focal deficit present.     Mental Status: He is alert and oriented to person, place, and time. Mental status is at baseline.  Psychiatric:        Attention and Perception: Attention and perception normal.        Mood and Affect: Mood is anxious. Affect is flat.        Speech: Speech is rapid and pressured.        Behavior: Behavior normal. Behavior is cooperative.        Thought Content: Thought content normal.  Cognition and Memory: Cognition and memory normal.        Judgment: Judgment is impulsive.    Review of Systems  Psychiatric/Behavioral:  Positive for depression. The patient is nervous/anxious.    Blood pressure 129/72, pulse 65, temperature 97.9 F (36.6 C), resp. rate (!) 21, height 5\' 7"  (1.702 m), weight 72.6 kg, SpO2 99%. Body mass index is 25.06 kg/m.   Treatment Plan  Summary: Daily contact with patient to assess and evaluate symptoms and progress in treatment and Medication management Consider adjusting Risperdal (Risperidone) 2 MG dose to target irritability and aggression if persistent Consider Propranolol 10 MG BID for anger management and emotional regulation. Monitor for mood instability Continue Zoloft 100 mg for depression  Lamictal 50 mg for  Mood Stabilizer Trazodone 100 mg for Antidepressant and sleep aid Anger Management Therapy - Given collateral reports of hostility and anger toward others. Myriam Forehand, NP 09/08/2023, 2:04 PM

## 2023-09-08 NOTE — Consult Note (Signed)
WOC Nurse Consult Note: Reason for Consult: left arm road rash Wound type: trauma  Pressure Injury POA: NA Measurement: see nursing flow sheets Wound bed:see nursing flow sheets Drainage (amount, consistency, odor) see nursing flow sheets Periwound: intact  Dressing procedure/placement/frequency: Cleanse wound with saline, pat dry Cover with single layer of xeroform gauze [place] # [id number]), top with silicone foam [place] # [id number]) or dry gauze and kerlix. Change every other day If patient is showering, ok to remove dressing to allow to shower and replace after shower.   Re consult if needed, will not follow at this time. Thanks  Teka Chanda [employer], RN,CWOCN, CNS, CWON-AP [phone])

## 2023-09-08 NOTE — [facility] Suicide Risk Assessment (Signed)
[facility] INPATIENT:  Family/Significant Other Suicide Prevention Education  Suicide Prevention Education:  Education Completed; Pryor Curia, godmother, [phone] has been identified by the patient as the family member/significant other with whom the patient will be residing, and identified as the person(s) who will aid the patient in the event of a mental health crisis (suicidal ideations/suicide attempt).  With written consent from the patient, the family member/significant other has been provided the following suicide prevention education, prior to the and/or following the discharge of the patient.  The suicide prevention education provided includes the following: Suicide risk factors Suicide prevention and interventions National Suicide Hotline telephone number [facility] assessment telephone number [facility] Emergency Assistance 911 [facility] and/or Residential Mobile Crisis Unit telephone number  Request made of family/significant other to: Remove weapons (e.g., guns, rifles, knives), all items previously/currently identified as safety concern.   Remove drugs/medications (over-the-counter, prescriptions, illicit drugs), all items previously/currently identified as a safety concern.  The family member/significant other verbalizes understanding of the suicide prevention education information provided.  The family member/significant other agrees to remove the items of safety concern listed above.  Harden Mo 09/08/2023, 9:54 AM

## 2023-09-08 NOTE — Plan of Care (Signed)
   Problem: Education: Goal: Emotional status will improve Outcome: Progressing Goal: Mental status will improve Outcome: Progressing Goal: Verbalization of understanding the information provided will improve Outcome: Progressing

## 2023-09-08 NOTE — Plan of Care (Signed)
Alert and oriented x4, RA, denies SI/HI/AVH and pain. In bed resting comfortably, denies anxiety, depression and rates sleep 7/5 with 5 being good. Encouraged to attend group, but but declined w/plans to go to bed early tonight.   Problem: Education: Goal: Knowledge of [facility] Education information/materials will improve Outcome: Progressing Goal: Emotional status will improve Outcome: Progressing Goal: Mental status will improve Outcome: Progressing Goal: Verbalization of understanding the information provided will improve Outcome: Progressing   Problem: Activity: Goal: Interest or engagement in activities will improve Outcome: Progressing Goal: Sleeping patterns will improve Outcome: Progressing   Problem: Coping: Goal: Ability to verbalize frustrations and anger appropriately will improve Outcome: Progressing Goal: Ability to demonstrate self-control will improve Outcome: Progressing   Problem: Health Behavior/Discharge Planning: Goal: Identification of resources available to assist in meeting health care needs will improve Outcome: Progressing Goal: Compliance with treatment plan for underlying cause of condition will improve Outcome: Progressing   Problem: Physical Regulation: Goal: Ability to maintain clinical measurements within normal limits will improve Outcome: Progressing   Problem: Safety: Goal: Periods of time without injury will increase Outcome: Progressing

## 2023-09-08 NOTE — Plan of Care (Signed)
  Problem: Education: Goal: Emotional status will improve 09/08/2023 0939 by Gardiner Barefoot, RN Outcome: Progressing 09/08/2023 0934 by Gardiner Barefoot, RN Outcome: Progressing Goal: Mental status will improve 09/08/2023 0939 by Gardiner Barefoot, RN Outcome: Progressing 09/08/2023 0934 by Gardiner Barefoot, RN Outcome: Progressing Goal: Verbalization of understanding the information provided will improve 09/08/2023 0939 by Gardiner Barefoot, RN Outcome: Progressing 09/08/2023 0934 by Gardiner Barefoot, RN Outcome: Progressing

## 2023-09-08 NOTE — Group Note (Signed)
[facility] LCSW Group Therapy Note   Group Date: 09/08/2023 Start Time: 1300 End Time: 1400   Type of Therapy/Topic:  Group Therapy:  Emotion Regulation  Participation Level:  Did Not Attend   Mood:  Description of Group:    The purpose of this group is to assist patients in learning to regulate negative emotions and experience positive emotions. Patients will be guided to discuss ways in which they have been vulnerable to their negative emotions. These vulnerabilities will be juxtaposed with experiences of positive emotions or situations, and patients challenged to use positive emotions to combat negative ones. Special emphasis will be placed on coping with negative emotions in conflict situations, and patients will process healthy conflict resolution skills.  Therapeutic Goals: Patient will identify two positive emotions or experiences to reflect on in order to balance out negative emotions:  Patient will label two or more emotions that they find the most difficult to experience:  Patient will be able to demonstrate positive conflict resolution skills through discussion or role plays:   Summary of Patient Progress: Patient did not attend group.     Therapeutic Modalities:   Cognitive Behavioral Therapy Feelings Identification Dialectical Behavioral Therapy   Lowry Ram, LCSW

## 2023-09-08 NOTE — Progress Notes (Signed)
   09/08/23 0827  Psych Admission Type (Psych Patients Only)  Admission Status Voluntary  Psychosocial Assessment  Patient Complaints Anxiety;Depression  Eye Contact Fair  Facial Expression Sad  Affect Sad  Speech Soft;Tangential  Interaction Minimal  Motor Activity Slow  Appearance/Hygiene In scrubs  Behavior Characteristics Cooperative  Mood Sad  Thought Process  Coherency WDL  Content WDL  Delusions None reported or observed  Perception WDL  Hallucination None reported or observed  Judgment Poor  Confusion None  Danger to Self  Current suicidal ideation? Denies  Self-Injurious Behavior No self-injurious ideation or behavior indicators observed or expressed   Agreement Not to Harm Self Yes  Description of Agreement Verbal  Danger to Others  Danger to Others None reported or observed

## 2023-09-08 NOTE — Group Note (Signed)
Date:  09/08/2023 Time:  10:35 PM  Group Topic/Focus:  Wrap-Up Group:   The focus of this group is to help patients review their daily goal of treatment and discuss progress on daily workbooks.    Participation Level:  Did Not Attend   Katina Dung 09/08/2023, 10:35 PM

## 2023-09-08 NOTE — Group Note (Signed)
Recreation Therapy Group Note   Group Topic:Goal Setting  Group Date: 09/08/2023 Start Time: 1000 End Time: 1100 Facilitators: Rosina Lowenstein, LRT, CTRS Location:  Craft Room  Group Description: Product/process development scientist. Patients were given many different magazines, a glue stick, markers, and a piece of cardstock paper. LRT and pts discussed the importance of having goals in life. LRT and pts discussed the difference between short-term and long-term goals, as well as what a SMART goal is. LRT encouraged pts to create a vision board, with images they picked and then cut out with safety scissors from the magazine, for themselves, that capture their short and long-term goals. LRT encouraged pts to show and explain their vision board to the group.   Goal Area(s) Addressed:  Patient will gain knowledge of short vs. long term goals.  Patient will identify goals for themselves. Patient will practice setting SMART goals. Patient will verbalize their goals to LRT and peers.   Affect/Mood: Appropriate, Blunted, and Flat   Participation Level: Active and Engaged   Participation Quality: Independent   Behavior: Appropriate and Cooperative   Speech/Thought Process: Coherent   Insight: Fair   Judgement: Fair    Modes of Intervention: Art, Clarification, Education, Open Conversation, and Support   Patient Response to Interventions:  Receptive   Education Outcome:  Acknowledges education   Clinical Observations/Individualized Feedback: Vonzell was active in their participation of session activities and group discussion. Pt came late to group, however, joined with no issue. Pt identified "I want to go back to school, get married and have kids" as his goals. Pt appropriately identified images to reflect these goals. Pt interacted well with LRT and peers duration of session.    Plan: Continue to engage patient in RT group sessions 2-3x/week.   Rosina Lowenstein, LRT, CTRS 09/08/2023 12:29 PM
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
## 2023-09-09 DIAGNOSIS — F3162 Bipolar disorder, current episode mixed, moderate: Secondary | ICD-10-CM | POA: Diagnosis not present

## 2023-09-09 NOTE — Group Note (Signed)
Recreation Therapy Group Note   Group Topic:Other  Group Date: 09/09/2023 Start Time: 1000 End Time: 1050 Facilitators: Clinton Gallant, CTRS Location:  Craft Room  Activity Description/Intervention: Therapeutic Drumming. Patients with peers and staff were given the opportunity to engage in a leader facilitated [facility] Group Empowerment Drumming Circle with staff from the [employer], in partnership with [employer]. Teaching laboratory technician and trained [employer], Theodoro Doing leading with LRT observing and documenting intervention and pt response. This evidenced-based practice targets 7 areas of health and wellbeing in the human experience including: stress-reduction, exercise, self-expression, camaraderie/support, nurturing, spirituality, and music-making (leisure).    Goal Area(s) Addresses:  Patient will engage in pro-social way in music group.  Patient will follow directions of drum leader on the first prompt. Patient will demonstrate no behavioral issues during group.  Patient will identify if a reduction in stress level occurs as a result of participation in therapeutic drum circle.    Affect/Mood: Appropriate   Participation Level: Engaged   Participation Quality: Independent   Behavior: Appropriate   Speech/Thought Process: Coherent   Insight: Fair   Judgement: Fair    Modes of Intervention: Activity and Music   Patient Response to Interventions:  Engaged   Education Outcome:  Acknowledges education   Clinical Observations/Individualized Feedback: Ian [name] was active in their participation of session activities and group discussion.  Pt followed along appropriately while interacting well with LRT and peers duration of session.    Plan: Continue to engage patient in RT group sessions 2-3x/week.   Ian [name], LRT, CTRS 09/09/2023 12:05 PM

## 2023-09-09 NOTE — Group Note (Signed)
[facility] LCSW Group Therapy Note   Group Date: 09/09/2023 Start Time: 1300 End Time: 1320  Type of Therapy and Topic:  Group Therapy:  Feelings around Relapse and Recovery  Participation Level:  Active   Mood:  Description of Group:    Patients in this group will discuss emotions they experience before and after a relapse. They will process how experiencing these feelings, or avoidance of experiencing them, relates to having a relapse. Facilitator will guide patients to explore emotions they have related to recovery. Patients will be encouraged to process which emotions are more powerful. They will be guided to discuss the emotional reaction significant others in their lives may have to patients' relapse or recovery. Patients will be assisted in exploring ways to respond to the emotions of others without this contributing to a relapse.  Therapeutic Goals: Patient will identify two or more emotions that lead to relapse for them:  Patient will identify two emotions that result when they relapse:  Patient will identify two emotions related to recovery:  Patient will demonstrate ability to communicate their needs through discussion and/or role plays.   Summary of Patient Progress: Patient was present for the entirety of the group process. He identified not getting so upset when he feels abandoned as something that he was not trying to relapse into. Pt spoke about the importance of remembering the people who you love and trust. He appeared open and receptive to feedback/comments from both his peers and facilitator.    Therapeutic Modalities:   Cognitive Behavioral Therapy Solution-Focused Therapy Assertiveness Training Relapse Prevention Therapy   Glenis Smoker, LCSW

## 2023-09-09 NOTE — Progress Notes (Signed)
[facility] MD Progress Note  09/09/2023 7:38 PM Ian [name]  MRN:  [id number] Subjective:  23 year old African American male presents with complaints of depression and anxiety. The patient was observed on the phone yelling and cursing, requiring redirection and reassurance by the nursing staff. Principal Problem: Schizophrenia (HCC) Diagnosis: Principal Problem:   Schizophrenia (HCC) Active Problems:   Severe recurrent major depressive disorder with psychotic features (HCC)   MDD (major depressive disorder)   Intermittent explosive disorder   PTSD (post-traumatic stress disorder)  Total Time spent with patient: 45 minutes  Past Psychiatric History: see below  Past Medical History:  Past Medical History:  Diagnosis Date   Acne    ADHD (attention deficit hyperactivity disorder)    Allergy    Asthma    Depression    Functional murmur    Migraines     Past Surgical History:  Procedure Laterality Date   NO PAST SURGERIES  2017   Family History:  Family History  Problem Relation Age of Onset   Hypertension Mother    Cancer Father        died at 40,unsure type, possibly colon   Hypertension Father    Stroke Maternal Grandmother 58   Heart disease Neg Hx    Family Psychiatric  History: none noted Social History:  Social History   Substance and Sexual Activity  Alcohol Use No     Social History   Substance and Sexual Activity  Drug Use No    Social History   Socioeconomic History   Marital status: Single    Spouse name: Not on file   Number of children: Not on file   Years of education: Not on file   Highest education level: Not on file  Occupational History   Not on file  Tobacco Use   Smoking status: Never   Smokeless tobacco: Never  Substance and Sexual Activity   Alcohol use: No   Drug use: No   Sexual activity: Yes    Birth control/protection: Condom  Other Topics Concern   Not on file  Social History Narrative   Lives with mom and older brother.  No pets or tobacco exposure (outside dog).  10th grade, [name]   Social Drivers of Corporate investment banker Strain: Not on file  Food Insecurity: No Food Insecurity (09/07/2023)   Hunger Vital Sign    Worried About Running Out of Food in the Last Year: Never true    Ran Out of Food in the Last Year: Never true  Recent Concern: Food Insecurity - Food Insecurity Present (06/30/2023)   Hunger Vital Sign    Worried About Running Out of Food in the Last Year: Never true    Ran Out of Food in the Last Year: Sometimes true  Transportation Needs: No Transportation Needs (09/07/2023)   PRAPARE - Administrator, Civil Service (Medical): No    Lack of Transportation (Non-Medical): No  Physical Activity: Insufficiently Active (02/09/2023)   Exercise Vital Sign    Days of Exercise per Week: 3 days    Minutes of Exercise per Session: 30 min  Stress: Not on file  Social Connections: Unknown (12/03/2021)   Received from [facility]   Social Network    Social Network: Not on file   Additional Social History:                         Sleep: Good  Appetite:  Good  Current Medications: Current Facility-Administered Medications  Medication Dose Route Frequency Provider Last Rate Last Admin   acetaminophen (TYLENOL) tablet 650 mg  650 mg Oral Q6H PRN Eligha Bridegroom, NP       alum & mag hydroxide-simeth (MAALOX/MYLANTA) 200-200-20 MG/5ML suspension 30 mL  30 mL Oral Q4H PRN Eligha Bridegroom, NP       haloperidol (HALDOL) tablet 5 mg  5 mg Oral TID PRN Eligha Bridegroom, NP       And   diphenhydrAMINE (BENADRYL) capsule 50 mg  50 mg Oral TID PRN Eligha Bridegroom, NP   50 mg at 09/09/23 0956   haloperidol lactate (HALDOL) injection 5 mg  5 mg Intramuscular TID PRN Eligha Bridegroom, NP       And   diphenhydrAMINE (BENADRYL) injection 50 mg  50 mg Intramuscular TID PRN Eligha Bridegroom, NP       And   LORazepam (ATIVAN) injection 2 mg  2 mg Intramuscular TID  PRN Eligha Bridegroom, NP       haloperidol lactate (HALDOL) injection 10 mg  10 mg Intramuscular TID PRN Eligha Bridegroom, NP       And   diphenhydrAMINE (BENADRYL) injection 50 mg  50 mg Intramuscular TID PRN Eligha Bridegroom, NP       And   LORazepam (ATIVAN) injection 2 mg  2 mg Intramuscular TID PRN Eligha Bridegroom, NP       hydrOXYzine (ATARAX) tablet 25 mg  25 mg Oral TID PRN Eligha Bridegroom, NP   25 mg at 09/08/23 1204   lamoTRIgine (LAMICTAL) tablet 50 mg  50 mg Oral Daily Myriam Forehand, NP   50 mg at 09/09/23 2440   magnesium hydroxide (MILK OF MAGNESIA) suspension 30 mL  30 mL Oral Daily PRN Eligha Bridegroom, NP       propranolol (INDERAL) tablet 10 mg  10 mg Oral Daily Myriam Forehand, NP   10 mg at 09/09/23 1027   risperiDONE (RISPERDAL) tablet 1 mg  1 mg Oral BID Myriam Forehand, NP   1 mg at 09/09/23 1719   sertraline (ZOLOFT) tablet 100 mg  100 mg Oral Daily Myriam Forehand, NP   100 mg at 09/09/23 2536   traZODone (DESYREL) tablet 50 mg  50 mg Oral QHS PRN Eligha Bridegroom, NP   50 mg at 09/07/23 2056    Lab Results: No results found for this or any previous visit (from the past 48 hours).  Blood Alcohol level:  Lab Results  Component Value Date   ETH <10 09/06/2023   ETH <10 08/04/2023    Metabolic Disorder Labs: Lab Results  Component Value Date   HGBA1C 5.0 06/30/2023   MPG 96.8 06/30/2023   No results found for: "PROLACTIN" Lab Results  Component Value Date   CHOL 133 02/09/2023   TRIG 37 02/09/2023   HDL 53 02/09/2023   CHOLHDL 2.5 02/09/2023   VLDL 9 05/16/2016   LDLCALC 71 02/09/2023   LDLCALC 70 05/16/2016    Physical Findings: AIMS:  , ,  ,  ,    CIWA:    COWS:     Musculoskeletal: Strength & Muscle Tone: within normal limits Gait & Station: normal Patient leans: N/A  Psychiatric Specialty Exam:  Presentation  General Appearance:  Appropriate for Environment  Eye Contact: Minimal (Initially escalated on the phone but cooperative after  redirection)  Speech: Clear and Coherent; Normal Rate  Speech Volume: Increased  Handedness: Right   Mood and Affect  Mood: Irritable; Depressed  Affect: Flat (Guarded)   Thought Process  Thought Processes: Coherent; Goal Directed  Descriptions of Associations:Intact  Orientation:Full (Time, Place and Person)  Thought Content:WDL  History of Schizophrenia/Schizoaffective disorder:Yes  Duration of Psychotic Symptoms:Less than six months  Hallucinations:Hallucinations: None Description of Auditory Hallucinations: denies Description of Visual Hallucinations: denies  Ideas of Reference:None  Suicidal Thoughts:Suicidal Thoughts: No SI Active Intent and/or Plan: -- (denies) SI Passive Intent and/or Plan: -- (denies)  Homicidal Thoughts:Homicidal Thoughts: No   Sensorium  Memory: Immediate Good; Remote Good; Recent Good  Judgment: Impaired  Insight: Fair   Chartered certified accountant: Fair  Attention Span: Fair  Recall: Good  Fund of Knowledge: Good  Language: Good   Psychomotor Activity  Psychomotor Activity: Psychomotor Activity: Normal   Assets  Assets: Housing; Health and safety inspector; Communication Skills   Sleep  Sleep: Sleep: Good Number of Hours of Sleep: 8    Physical Exam: Physical Exam Vitals and nursing note reviewed.  Constitutional:      Appearance: Normal appearance.  HENT:     Head: Normocephalic and atraumatic.     Nose: Nose normal.  Pulmonary:     Effort: Pulmonary effort is normal.  Musculoskeletal:        General: Normal range of motion.  Neurological:     General: No focal deficit present.     Mental Status: He is alert and oriented to person, place, and time. Mental status is at baseline.  Psychiatric:        Attention and Perception: Attention and perception normal.        Mood and Affect: Mood is anxious. Affect is flat.        Speech: Speech is rapid and pressured.         Behavior: Behavior is agitated. Behavior is cooperative.        Thought Content: Thought content normal.        Cognition and Memory: Cognition and memory normal.        Judgment: Judgment is impulsive.    Review of Systems  Psychiatric/Behavioral:  Positive for depression. The patient is nervous/anxious.   All other systems reviewed and are negative.  Blood pressure 119/60, pulse 67, temperature 97.9 F (36.6 C), resp. rate 19, height 5\' 7"  (1.702 m), weight 72.6 kg, SpO2 100%. Body mass index is 25.06 kg/m.   Treatment Plan Summary: Daily contact with patient to assess and evaluate symptoms and progress in treatment and Medication management Risperdal (Risperidone) 2 MG dose to target irritability and aggression if persistent Consider Propranolol 10 MG BID for anger management and emotional regulation. Monitor for mood instability Continue Zoloft 100 mg for depression  Lamictal 50 mg for  Mood Stabilizer Trazodone 100 mg for Antidepressant and sleep aid Anger Management Therapy - Given collateral reports of hostility and anger toward others.  Myriam Forehand, NP 09/09/2023, 7:38 PM

## 2023-09-09 NOTE — Progress Notes (Signed)
   09/08/23 1930  Psych Admission Type (Psych Patients Only)  Admission Status Voluntary  Psychosocial Assessment  Patient Complaints Depression  Eye Contact Fair  Facial Expression Sad  Affect Sad  Speech Logical/coherent  Interaction Minimal  Motor Activity Slow  Appearance/Hygiene In scrubs  Behavior Characteristics Cooperative  Mood Sad  Aggressive Behavior  Effect No apparent injury  Thought Process  Coherency WDL  Content WDL  Delusions None reported or observed  Perception WDL  Hallucination None reported or observed  Judgment Impaired  Confusion WDL  Danger to Self  Current suicidal ideation? Denies  Self-Injurious Behavior No self-injurious ideation or behavior indicators observed or expressed   Agreement Not to Harm Self Yes  Description of Agreement Verbal  Danger to Others  Danger to Others None reported or observed

## 2023-09-09 NOTE — Plan of Care (Signed)
   Problem: Education: Goal: Emotional status will improve Outcome: Progressing Goal: Verbalization of understanding the information provided will improve Outcome: Progressing

## 2023-09-09 NOTE — Progress Notes (Signed)
   09/09/23 1000  Psych Admission Type (Psych Patients Only)  Admission Status Voluntary  Psychosocial Assessment  Patient Complaints Depression;Anxiety  Eye Contact Poor  Facial Expression Sad  Affect Sad  Speech Logical/coherent  Interaction Guarded  Motor Activity Slow  Appearance/Hygiene In scrubs  Behavior Characteristics Cooperative  Mood Depressed;Sad  Thought Process  Coherency WDL  Content WDL  Delusions None reported or observed  Perception WDL  Hallucination None reported or observed  Judgment Impaired  Confusion WDL  Danger to Self  Current suicidal ideation? Denies (Denies)  Self-Injurious Behavior No self-injurious ideation or behavior indicators observed or expressed  (None)  Agreement Not to Harm Self Yes  Description of Agreement Verbal  Danger to Others  Danger to Others None reported or observed

## 2023-09-09 NOTE — Progress Notes (Signed)
   09/09/23 0600  15 Minute Checks  Location Bedroom  Visual Appearance Calm  Behavior Sleeping  Sleep (Behavioral Health Patients Only)  Calculate sleep? (Click Yes once per 24 hr at 0600 safety check) Yes  Documented sleep last 24 hours 15.75

## 2023-09-10 DIAGNOSIS — F3162 Bipolar disorder, current episode mixed, moderate: Secondary | ICD-10-CM | POA: Diagnosis not present

## 2023-09-10 MED ORDER — ENSURE ENLIVE PO LIQD
1.0000 | Freq: Two times a day (BID) | ORAL | Status: DC
  Administered 2023-09-10 – 2023-09-11 (×2): 237 mL via ORAL

## 2023-09-10 NOTE — Group Note (Signed)
LCSW Group Therapy Note  Group Date: 09/10/2023 Start Time: 1330 End Time: 1445   Type of Therapy and Topic:  Group Therapy: Anger Cues and Responses  Participation Level:  Active   Description of Group:   In this group, patients learned how to recognize the physical, cognitive, emotional, and behavioral responses they have to anger-provoking situations.  They identified a recent time they became angry and how they reacted.  They analyzed how their reaction was possibly beneficial and how it was possibly unhelpful.  The group discussed a variety of healthier coping skills that could help with such a situation in the future.  Focus was placed on how helpful it is to recognize the underlying emotions to our anger, because working on those can lead to a more permanent solution as well as our ability to focus on the important rather than the urgent.  Therapeutic Goals: Patients will remember their last incident of anger and how they felt emotionally and physically, what their thoughts were at the time, and how they behaved. Patients will identify how their behavior at that time worked for them, as well as how it worked against them. Patients will explore possible new behaviors to use in future anger situations. Patients will learn that anger itself is normal and cannot be eliminated, and that healthier reactions can assist with resolving conflict rather than worsening situations.  Summary of Patient Progress:   Patient was active during the group. He was able to share how his anger has negatively impacted his life. He shared how his anger recently resulted in him jumping onto his girlfriends vehicle.  Patient displayed poor insight into how this was a poor decision.    Therapeutic Modalities:   Cognitive Behavioral Therapy    Ian [name] 09/10/2023  3:17 PM

## 2023-09-10 NOTE — Progress Notes (Signed)
 Pt calm and pleasant during assessment denying SI/HI/AVH. Pt observed by this Clinical research associate interacting appropriately with staff and peers on the unit. Pt compliant with medication administration per MD orders. Pt given education, support, and encouragement to be active in his treatment plan. Pt being monitored Q 15 minutes for safety per unit protocol, remains safe on the unit

## 2023-09-10 NOTE — Group Note (Signed)
Date:  09/10/2023 Time:  5:00 AM  Group Topic/Focus:  Building Self Esteem:   The Focus of this group is helping patients become aware of the effects of self-esteem on their lives, the things they and others do that enhance or undermine their self-esteem, seeing the relationship between their level of self-esteem and the choices they make and learning ways to enhance self-esteem.    Participation Level:  Did Not Attend  Participation Quality:   none  Affect:   none  Cognitive:   none  Insight: None  Engagement in Group:   none  Modes of Intervention:   none  Additional Comments:  none   Sumayya Muha 09/10/2023, 5:00 AM

## 2023-09-10 NOTE — Progress Notes (Signed)
Patient isolative to self and room. Did not come out for snacks, declined any sleep meds. Forwards minimal. Answered questions. Encouragement and support provided. Safety checks maintained. Medications given as prescribed. Pt receptive and remains safe on unit with q 15 min checks.

## 2023-09-10 NOTE — Plan of Care (Signed)
  Problem: Education: Goal: Emotional status will improve Outcome: Progressing Goal: Mental status will improve Outcome: Progressing Goal: Verbalization of understanding the information provided will improve Outcome: Progressing   Problem: Activity: Goal: Interest or engagement in activities will improve Outcome: Not Progressing   Problem: Coping: Goal: Ability to verbalize frustrations and anger appropriately will improve Outcome: Progressing

## 2023-09-10 NOTE — Group Note (Signed)
Date:  09/10/2023 Time:  4:43 PM  Group Topic/Focus:  Activity Group: The focus of the group is to promote activity for the patients and encourage them to go outside to the courtyard and get some fresh air and some exercise.    Participation Level:  Active  Participation Quality:  Appropriate  Affect:  Appropriate  Cognitive:  Appropriate  Insight: Appropriate  Engagement in Group:  Engaged  Modes of Intervention:  Activity  Additional Comments:    Ian [name] 09/10/2023, 4:43 PM

## 2023-09-10 NOTE — Progress Notes (Signed)
   09/10/23 1000  Psych Admission Type (Psych Patients Only)  Admission Status Voluntary  Psychosocial Assessment  Patient Complaints Depression (depression since he lost dad)  Eye Contact Fair  Facial Expression Flat  Affect Sad;Preoccupied  Speech Logical/coherent  Interaction Guarded  Motor Activity Other (Comment) (WNL)  Appearance/Hygiene In scrubs  Behavior Characteristics Cooperative  Mood Preoccupied (worried about taking to his girlfriend)  Aggressive Behavior  Effect No apparent injury  Thought Process  Coherency WDL  Content Preoccupation (thoughts of taking to girlfriend)  Delusions None reported or observed  Perception WDL  Hallucination None reported or observed  Judgment Impaired  Confusion WDL  Danger to Self  Current suicidal ideation? Denies  Self-Injurious Behavior No self-injurious ideation or behavior indicators observed or expressed   Agreement Not to Harm Self Yes  Description of Agreement verbal

## 2023-09-10 NOTE — Plan of Care (Signed)
   Problem: Education: Goal: Emotional status will improve Outcome: Progressing

## 2023-09-10 NOTE — Progress Notes (Signed)
[facility] MD Progress Note  09/10/2023 6:02 PM Ian [name]  MRN:  [id number] Subjective:  23-Year-Old African Male, states, "I am ready to go home." He reports feeling stable and denies any current concerns. The patient denies suicidal ideation (SI), homicidal ideation (HI), self-harm thoughts, auditory or visual hallucinations (AVH), or paranoia. He expresses motivation for discharge and willingness to follow up with outpatient treatment as needed. Principal Problem: Schizophrenia (HCC) Diagnosis: Principal Problem:   Schizophrenia (HCC) Active Problems:   Severe recurrent major depressive disorder with psychotic features (HCC)   MDD (major depressive disorder)   Intermittent explosive disorder   PTSD (post-traumatic stress disorder)  Total Time spent with patient: 1.5 hours  Past Psychiatric History: see below  Past Medical History:  Past Medical History:  Diagnosis Date   Acne    ADHD (attention deficit hyperactivity disorder)    Allergy    Asthma    Depression    Functional murmur    Migraines     Past Surgical History:  Procedure Laterality Date   NO PAST SURGERIES  2017   Family History:  Family History  Problem Relation Age of Onset   Hypertension Mother    Cancer Father        died at 40,unsure type, possibly colon   Hypertension Father    Stroke Maternal Grandmother 73   Heart disease Neg Hx    Family Psychiatric  History: none reported  Social History:  Social History   Substance and Sexual Activity  Alcohol Use No     Social History   Substance and Sexual Activity  Drug Use No    Social History   Socioeconomic History   Marital status: Single    Spouse name: Not on file   Number of children: Not on file   Years of education: Not on file   Highest education level: Not on file  Occupational History   Not on file  Tobacco Use   Smoking status: Never   Smokeless tobacco: Never  Substance and Sexual Activity   Alcohol use: No   Drug use: No    Sexual activity: Yes    Birth control/protection: Condom  Other Topics Concern   Not on file  Social History Narrative   Lives with mom and older brother. No pets or tobacco exposure (outside dog).  10th grade, [name]   Social Drivers of Corporate investment banker Strain: Not on file  Food Insecurity: No Food Insecurity (09/07/2023)   Hunger Vital Sign    Worried About Running Out of Food in the Last Year: Never true    Ran Out of Food in the Last Year: Never true  Recent Concern: Food Insecurity - Food Insecurity Present (06/30/2023)   Hunger Vital Sign    Worried About Running Out of Food in the Last Year: Never true    Ran Out of Food in the Last Year: Sometimes true  Transportation Needs: No Transportation Needs (09/07/2023)   PRAPARE - Administrator, Civil Service (Medical): No    Lack of Transportation (Non-Medical): No  Physical Activity: Insufficiently Active (02/09/2023)   Exercise Vital Sign    Days of Exercise per Week: 3 days    Minutes of Exercise per Session: 30 min  Stress: Not on file  Social Connections: Unknown (12/03/2021)   Received from [facility]   Social Network    Social Network: Not on file   Additional Social History:  Sleep: Good  Appetite:  Good  Current Medications: Current Facility-Administered Medications  Medication Dose Route Frequency Provider Last Rate Last Admin   acetaminophen (TYLENOL) tablet 650 mg  650 mg Oral Q6H PRN Eligha Bridegroom, NP       alum & mag hydroxide-simeth (MAALOX/MYLANTA) 200-200-20 MG/5ML suspension 30 mL  30 mL Oral Q4H PRN Eligha Bridegroom, NP       haloperidol (HALDOL) tablet 5 mg  5 mg Oral TID PRN Eligha Bridegroom, NP       And   diphenhydrAMINE (BENADRYL) capsule 50 mg  50 mg Oral TID PRN Eligha Bridegroom, NP   50 mg at 09/09/23 0956   haloperidol lactate (HALDOL) injection 5 mg  5 mg Intramuscular TID PRN Eligha Bridegroom, NP       And    diphenhydrAMINE (BENADRYL) injection 50 mg  50 mg Intramuscular TID PRN Eligha Bridegroom, NP       And   LORazepam (ATIVAN) injection 2 mg  2 mg Intramuscular TID PRN Eligha Bridegroom, NP       haloperidol lactate (HALDOL) injection 10 mg  10 mg Intramuscular TID PRN Eligha Bridegroom, NP       And   diphenhydrAMINE (BENADRYL) injection 50 mg  50 mg Intramuscular TID PRN Eligha Bridegroom, NP       And   LORazepam (ATIVAN) injection 2 mg  2 mg Intramuscular TID PRN Eligha Bridegroom, NP       feeding supplement (ENSURE ENLIVE / ENSURE PLUS) liquid 237 mL  1 Bottle Oral BID BM Myriam Forehand, NP   237 mL at 09/10/23 1302   hydrOXYzine (ATARAX) tablet 25 mg  25 mg Oral TID PRN Eligha Bridegroom, NP   25 mg at 09/10/23 1646   lamoTRIgine (LAMICTAL) tablet 50 mg  50 mg Oral Daily Myriam Forehand, NP   50 mg at 09/10/23 8295   magnesium hydroxide (MILK OF MAGNESIA) suspension 30 mL  30 mL Oral Daily PRN Eligha Bridegroom, NP       propranolol (INDERAL) tablet 10 mg  10 mg Oral Daily Myriam Forehand, NP   10 mg at 09/10/23 0850   risperiDONE (RISPERDAL) tablet 1 mg  1 mg Oral BID Myriam Forehand, NP   1 mg at 09/10/23 1644   sertraline (ZOLOFT) tablet 100 mg  100 mg Oral Daily Myriam Forehand, NP   100 mg at 09/10/23 6213   traZODone (DESYREL) tablet 50 mg  50 mg Oral QHS PRN Eligha Bridegroom, NP   50 mg at 09/07/23 2056    Lab Results: No results found for this or any previous visit (from the past 48 hours).  Blood Alcohol level:  Lab Results  Component Value Date   ETH <10 09/06/2023   ETH <10 08/04/2023    Metabolic Disorder Labs: Lab Results  Component Value Date   HGBA1C 5.0 06/30/2023   MPG 96.8 06/30/2023   No results found for: "PROLACTIN" Lab Results  Component Value Date   CHOL 133 02/09/2023   TRIG 37 02/09/2023   HDL 53 02/09/2023   CHOLHDL 2.5 02/09/2023   VLDL 9 05/16/2016   LDLCALC 71 02/09/2023   LDLCALC 70 05/16/2016    Physical Findings: AIMS:  , ,  ,  ,    CIWA:     COWS:     Musculoskeletal: Strength & Muscle Tone: within normal limits Gait & Station: normal Patient leans: N/A  Psychiatric Specialty Exam:  Presentation  General Appearance:  Neat;  Meticulous (Calm, cooperative, engaged in care.)  Eye Contact: Good  Speech: Clear and Coherent  Speech Volume: Normal  Handedness: Right   Mood and Affect  Mood: Euthymic  Affect: Appropriate; Congruent   Thought Process  Thought Processes: Goal Directed; Coherent  Descriptions of Associations:Intact  Orientation:Full (Time, Place and Person)  Thought Content:Logical  History of Schizophrenia/Schizoaffective disorder:Yes  Duration of Psychotic Symptoms:Greater than six months  Hallucinations:Hallucinations: None Description of Auditory Hallucinations: denies Description of Visual Hallucinations: denies  Ideas of Reference:None  Suicidal Thoughts:Suicidal Thoughts: No SI Active Intent and/or Plan: -- (denies) SI Passive Intent and/or Plan: -- (denies)  Homicidal Thoughts:Homicidal Thoughts: No   Sensorium  Memory: Immediate Good; Recent Good; Remote Good  Judgment: Fair  Insight: Fair (acknowledges mental health needs and importance of follow-up care.)   Executive Functions  Concentration: Fair  Attention Span: Fair  Recall: Good  Fund of Knowledge: Good  Language: Good   Psychomotor Activity  Psychomotor Activity: Psychomotor Activity: Normal   Assets  Assets: Communication Skills; Housing; Social Support; Financial Resources/Insurance   Sleep  Sleep: Sleep: Good Number of Hours of Sleep: 7    Physical Exam: Physical Exam Vitals and nursing note reviewed.  Constitutional:      Appearance: Normal appearance.  HENT:     Head: Normocephalic and atraumatic.     Nose: Nose normal.  Pulmonary:     Effort: Pulmonary effort is normal.  Musculoskeletal:        General: Normal range of motion.     Cervical back: Normal range  of motion.  Neurological:     General: No focal deficit present.     Mental Status: He is alert and oriented to person, place, and time. Mental status is at baseline.  Psychiatric:        Attention and Perception: Attention and perception normal.        Mood and Affect: Mood normal. Affect is flat.        Speech: Speech normal.        Behavior: Behavior normal. Behavior is cooperative.        Thought Content: Thought content normal.        Cognition and Memory: Cognition and memory normal.        Judgment: Judgment is impulsive.    Review of Systems  All other systems reviewed and are negative.  Blood pressure 138/74, pulse 68, temperature 97.7 F (36.5 C), resp. rate 16, height 5\' 7"  (1.702 m), weight 72.6 kg, SpO2 100%. Body mass index is 25.06 kg/m.   Treatment Plan Summary: Daily contact with patient to assess and evaluate symptoms and progress in treatment and Medication management Risperdal (Risperidone) 2 MG dose to target irritability and aggression if persistent Consider Propranolol 10 MG BID for anger management and emotional regulation. Monitor for mood instability Continue Zoloft 100 mg for depression  Lamictal 50 mg for  Mood Stabilizer Trazodone 100 mg for Antidepressant and sleep aid Myriam Forehand, NP 09/10/2023, 6:02 PM

## 2023-09-11 DIAGNOSIS — F3162 Bipolar disorder, current episode mixed, moderate: Secondary | ICD-10-CM | POA: Diagnosis not present

## 2023-09-11 MED ORDER — PROPRANOLOL HCL 10 MG PO TABS: 10.0000 mg | ORAL_TABLET | Freq: Every day | ORAL | 0 refills | Status: DC

## 2023-09-11 MED ORDER — SERTRALINE HCL 100 MG PO TABS: 100.0000 mg | ORAL_TABLET | Freq: Every day | ORAL | 0 refills | Status: DC

## 2023-09-11 MED ORDER — HYDROXYZINE HCL 25 MG PO TABS: 25.0000 mg | ORAL_TABLET | Freq: Three times a day (TID) | ORAL | 0 refills | Status: AC | PRN

## 2023-09-11 MED ORDER — LAMOTRIGINE 25 MG PO TABS: 50.0000 mg | ORAL_TABLET | Freq: Every day | ORAL | 0 refills | Status: DC

## 2023-09-11 MED ORDER — TRAZODONE HCL 50 MG PO TABS
50.0000 mg | ORAL_TABLET | Freq: Every evening | ORAL | 0 refills | Status: DC | PRN
Start: 2023-09-11 — End: 2024-03-04

## 2023-09-11 MED ORDER — RISPERIDONE 1 MG PO TABS: 2.0000 mg | ORAL_TABLET | Freq: Every day | ORAL | 0 refills | Status: DC

## 2023-09-11 NOTE — Progress Notes (Signed)
Pt. Stable. Verbalizes understanding of discharge instructions.

## 2023-09-11 NOTE — Plan of Care (Signed)
   Problem: Education: Goal: Emotional status will improve Outcome: Progressing Goal: Mental status will improve Outcome: Progressing

## 2023-09-11 NOTE — Discharge Instructions (Signed)
 Discharge instructions given. Patient verbalizes understanding.

## 2023-09-11 NOTE — Group Note (Signed)
Date:  09/11/2023 Time:  10:21 AM  Group Topic/Focus:  Goals Group:   The focus of this group is to help patients establish daily goals to achieve during treatment and discuss how the patient can incorporate goal setting into their daily lives to aide in recovery.    Participation Level:  Active  Participation Quality:  Appropriate  Affect:  Appropriate  Cognitive:  Appropriate  Insight: Appropriate  Engagement in Group:  Engaged  Modes of Intervention:  Discussion, Education, and Support  Additional Comments:    Wilford Corner 09/11/2023, 10:21 AM
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
## 2023-09-11 NOTE — Progress Notes (Signed)
   09/11/23 1000  Psych Admission Type (Psych Patients Only)  Admission Status Voluntary  Psychosocial Assessment  Patient Complaints Depression  Eye Contact Fair  Facial Expression Flat  Affect Preoccupied  Speech Logical/coherent  Interaction Guarded  Motor Activity Slow  Appearance/Hygiene Unremarkable  Behavior Characteristics Cooperative  Mood Preoccupied  Aggressive Behavior  Effect No apparent injury  Thought Process  Coherency WDL  Content Preoccupation  Delusions None reported or observed  Perception WDL  Hallucination None reported or observed  Judgment Limited  Confusion WDL  Danger to Self  Current suicidal ideation? Denies (Drnird)  Self-Injurious Behavior  (No self-injurious)  Agreement Not to Harm Self Yes  Description of Agreement verbal  Danger to Others  Danger to Others None reported or observed   Remains stable, cooperative and pleasant. Pt. Is schedule to be discharged today., but his ride will not be here until after 1300.

## 2023-09-11 NOTE — Progress Notes (Signed)
  [facility] Adult Case Management Discharge Plan :  Will you be returning to the same living situation after discharge:  Yes,  Patient to return home. .  At discharge, do you have transportation home?: Yes,  CSW to arrange transportation on patient's behalf.  Do you have the ability to pay for your medications:  Yes, [place] MEDICAID PREPAID HEALTH PLAN / [place] MEDICAID HEALTHY BLUE   Release of information consent forms completed and in the chart;  Patient's signature needed at discharge.  Patient to Follow up at:  Follow-up Information     [facility]. Go to.   Specialty: Behavioral Health Why: Medication management appointment is 09/17/23 at 3:40 PM.  Therapy appointment is 09/24/23 at 12 PM. Contact information: [address] [place] [address] [phone]                 Next level of care provider has access to [facility] Link:no  Safety Planning and Suicide Prevention discussed: Yes, Education Completed; Pryor Curia, godmother, [phone] has been identified by the patient as the family member/significant other with whom the patient will be residing, and identified as the person(s) who will aid the patient in the event of a mental health crisis (suicidal ideations/suicide attempt).  With written consent from the patient, the family member/significant other has been provided the following suicide prevention education, prior to the and/or following the discharge of the patient.      Has patient been referred to the Quitline?: Patient does not use tobacco/nicotine products  Patient has been referred for addiction treatment: No known substance use disorder.  Lowry Ram, LCSW 09/11/2023, 10:02 AM

## 2023-09-11 NOTE — Progress Notes (Signed)
Patient ID: Ian [name], male   DOB: [birth year], 23 y.o.   MRN: [id number] Patient very stable. Discharge instruction given and patient verbalized understanding. Pt. Received all of his belongings from the locker. Pt. To go home per taxi.

## 2023-09-11 NOTE — Plan of Care (Signed)

## 2023-09-17 DIAGNOSIS — F819 Developmental disorder of scholastic skills, unspecified: Secondary | ICD-10-CM | POA: Diagnosis not present

## 2023-09-17 DIAGNOSIS — Z5181 Encounter for therapeutic drug level monitoring: Secondary | ICD-10-CM | POA: Diagnosis not present

## 2023-09-17 DIAGNOSIS — F251 Schizoaffective disorder, depressive type: Secondary | ICD-10-CM | POA: Diagnosis not present

## 2023-09-17 DIAGNOSIS — F909 Attention-deficit hyperactivity disorder, unspecified type: Secondary | ICD-10-CM | POA: Diagnosis not present

## 2023-09-17 DIAGNOSIS — Z79899 Other long term (current) drug therapy: Secondary | ICD-10-CM | POA: Diagnosis not present

## 2023-10-12 DIAGNOSIS — F819 Developmental disorder of scholastic skills, unspecified: Secondary | ICD-10-CM | POA: Diagnosis not present

## 2023-10-12 DIAGNOSIS — F251 Schizoaffective disorder, depressive type: Secondary | ICD-10-CM | POA: Diagnosis not present

## 2023-10-12 DIAGNOSIS — F909 Attention-deficit hyperactivity disorder, unspecified type: Secondary | ICD-10-CM | POA: Diagnosis not present

## 2023-12-12 ENCOUNTER — Encounter (HOSPITAL_COMMUNITY): Payer: Self-pay | Admitting: Emergency Medicine

## 2023-12-12 ENCOUNTER — Ambulatory Visit (HOSPITAL_COMMUNITY)
Admission: EM | Admit: 2023-12-12 | Discharge: 2023-12-12 | Disposition: A | Discharge status: Home / Self Care | Payer: MEDICAID | Attending: Family Medicine | Admitting: Family Medicine

## 2023-12-12 DIAGNOSIS — J4521 Mild intermittent asthma with (acute) exacerbation: Secondary | ICD-10-CM

## 2023-12-12 DIAGNOSIS — J4520 Mild intermittent asthma, uncomplicated: Secondary | ICD-10-CM | POA: Diagnosis not present

## 2023-12-12 MED ORDER — ALBUTEROL SULFATE (2.5 MG/3ML) 0.083% IN NEBU
2.5000 mg | INHALATION_SOLUTION | Freq: Once | RESPIRATORY_TRACT | Status: AC
  Administered 2023-12-12: 2.5 mg via RESPIRATORY_TRACT

## 2023-12-12 MED ORDER — PREDNISONE 20 MG PO TABS: 40.0000 mg | ORAL_TABLET | Freq: Every day | ORAL | 0 refills | Status: AC

## 2023-12-12 MED ORDER — ALBUTEROL SULFATE (2.5 MG/3ML) 0.083% IN NEBU
INHALATION_SOLUTION | RESPIRATORY_TRACT | Status: AC
  Filled 2023-12-12: qty 3

## 2023-12-12 MED ORDER — ALBUTEROL SULFATE HFA 108 (90 BASE) MCG/ACT IN AERS: 2.0000 | INHALATION_SPRAY | RESPIRATORY_TRACT | 0 refills | Status: AC | PRN

## 2023-12-12 NOTE — ED Triage Notes (Signed)
 Pt was sick couple days ago and "hasn't been able to breath". Reports last night when laying down felt like couldn't breath and chest felt tight. Reports cough-is productive and congestion. Took Mucous relief and cold and cough medication.

## 2023-12-12 NOTE — Discharge Instructions (Signed)
 Albuterol  inhaler--do 2 puffs every 4 hours as needed for shortness of breath or wheezing (this is the same medicine that we gave you in the breathing treatment in the clinic)  Take prednisone  20 mg--2 daily for 5 days  Please follow-up with your primary care about this issue.

## 2023-12-12 NOTE — ED Provider Notes (Signed)
 [facility]    CSN: [id number] Arrival date & time: 12/12/23  1120      History   Chief Complaint Chief Complaint  Patient presents with   Shortness of Breath   Cough    HPI Ian [name] is a 23 y.o. male.    Shortness of Breath Associated symptoms: cough   Cough Associated symptoms: shortness of breath   Here for chest tightness and shortness of breath and wheezing.  About 2 or 3 days ago he had some rhinorrhea and cough.  The rhinorrhea and nasal congestion have subsided but about 2 days ago he started having chest tightness and wheezing.  He has a history of asthma and does not have an inhaler to use at home.  No fever or chills.  NKDA  He does also take propranolol , Risperdal , Lamictal , trazodone , and Zoloft .  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 Authorizing Provider  albuterol  (VENTOLIN  HFA) 108 (90 Base) MCG/ACT inhaler Inhale 2 puffs into the lungs every 4 (four) hours as needed for wheezing or shortness of breath. 12/12/23  Yes Pasha Gadison K, MD   predniSONE (DELTASONE) 20 MG tablet Take 2 tablets (40 mg total) by mouth daily with breakfast for 5 days. 12/12/23 12/17/23 Yes Koray Soter, Paige Boatman, MD  lamoTRIgine  (LAMICTAL ) 25 MG tablet Take 2 tablets (50 mg total) by mouth daily. 09/12/23 10/12/23  Rosalene Colon, NP  propranolol  (INDERAL ) 10 MG tablet Take 1 tablet (10 mg total) by mouth daily. 09/12/23 10/12/23  Rosalene Colon, NP  risperiDONE  (RISPERDAL ) 1 MG tablet Take 2 tablets (2 mg total) by mouth daily. 09/11/23 10/11/23  Rosalene Colon, NP  sertraline  (ZOLOFT ) 100 MG tablet Take 1 tablet (100 mg total) by mouth daily. 09/12/23 10/12/23  Rosalene Colon, NP  traZODone  (DESYREL ) 50 MG tablet Take 1 tablet (50 mg total) by mouth at bedtime as needed for up to 10 days for sleep. 09/11/23 09/21/23  Rosalene Colon, NP    Family History Family History  Problem Relation Age of Onset   Hypertension Mother    Cancer Father        died at 40,unsure type, possibly colon   Hypertension Father    Stroke Maternal Grandmother 44   Heart disease Neg Hx     Social History Social History   Tobacco Use   Smoking status: Never   Smokeless tobacco: Never  Substance Use Topics   Alcohol use: No   Drug use: No     Allergies   Patient has no known allergies.  Review of Systems Review of Systems  Respiratory:  Positive for cough and shortness of breath.      Physical Exam Triage Vital Signs ED Triage Vitals [12/12/23 1128]  Encounter Vitals Group     BP 139/84     Systolic BP Percentile      Diastolic BP Percentile      Pulse Rate 70     Resp 15     Temp 98 F (36.7 C)     Temp src      SpO2 100 %     Weight      Height      Head Circumference      Peak Flow      Pain Score 0     Pain Loc      Pain Education      Exclude from Growth Chart    No data found.  Updated Vital Signs BP 139/84 (BP Location: Right Arm)   Pulse 70   Temp 98 F (36.7 C)   Resp 15   SpO2 100%   Visual Acuity Right Eye Distance:   Left Eye  Distance:   Bilateral Distance:    Right Eye Near:   Left Eye Near:    Bilateral Near:     Physical Exam Vitals reviewed.  Constitutional:      General: He is not in acute distress.    Appearance: He is not toxic-appearing.  HENT:     Nose: Nose normal.     Mouth/Throat:     Mouth: Mucous membranes are moist.     Pharynx: No oropharyngeal exudate or posterior oropharyngeal erythema.  Eyes:     Extraocular Movements: Extraocular movements intact.     Conjunctiva/sclera: Conjunctivae normal.     Pupils: Pupils are equal, round, and reactive to light.  Cardiovascular:     Rate and Rhythm: Normal rate and regular rhythm.     Heart sounds: No murmur heard. Pulmonary:     Effort: No respiratory distress.     Breath sounds: No rhonchi or rales.     Comments: There are expiratory wheezes heard throughout the lung fields with reduced air movement. Musculoskeletal:     Cervical back: Neck supple.  Lymphadenopathy:     Cervical: No cervical adenopathy.  Skin:    Capillary Refill: Capillary refill takes less than 2 seconds.     Coloration: Skin is not jaundiced or pale.  Neurological:     General: No focal deficit present.     Mental Status: He is alert and oriented to person, place, and time.  Psychiatric:        Behavior: Behavior normal.      UC Treatments / Results  Labs (all labs ordered are listed, but only abnormal results are displayed) Labs Reviewed - No data to display  EKG   Radiology No results found.  Procedures Procedures (including critical care time)  Medications Ordered in UC Medications  albuterol  (PROVENTIL ) (2.5 MG/3ML) 0.083% nebulizer solution 2.5 mg (2.5 mg Nebulization Given 12/12/23 1215)    Initial Impression / Assessment and Plan / UC Course  I have reviewed the triage vital signs and the nursing notes.  Pertinent labs & imaging results that were available during my care of the patient were reviewed by me and considered in my medical  decision making (see chart for details).     He feels better after the albuterol  treatment.  On reexamination air movement is much better, though the wheezing is actually  heard more loudly. Albuterol  and prednisone are sent in for the asthma exacerbation.  He states he will be seeing his primary care sometime soon.  Final Clinical Impressions(s) / UC Diagnoses   Final diagnoses:  Mild intermittent asthma with exacerbation     Discharge Instructions      Albuterol  inhaler--do 2 puffs every 4 hours as needed for shortness of breath or wheezing (this is the same medicine that we gave you in the breathing treatment in the clinic)  Take prednisone 20 mg--2 daily for 5 days  Please follow-up with your primary care about this issue.    ED Prescriptions     Medication Sig Dispense Auth. Provider   albuterol  (VENTOLIN  HFA) 108 (90 Base) MCG/ACT inhaler Inhale 2 puffs into the lungs every 4 (four) hours as needed for wheezing or shortness of breath. 1 each Ann Keto, MD   predniSONE (DELTASONE) 20 MG tablet Take 2 tablets (40 mg total) by mouth daily with breakfast for 5 days. 10 tablet Ellsworth Haas Korrina Zern K, MD      PDMP not reviewed this encounter.   Ann Keto, MD 12/12/23 1239

## 2024-01-13 ENCOUNTER — Telehealth: Payer: Self-pay | Admitting: Family Medicine

## 2024-01-13 NOTE — Telephone Encounter (Unsigned)
 Copied from CRM [id number]. Topic: Clinical - Medication Question >> Jan 13, 2024 12:14 PM Ian [name] wrote: Reason for CRM: Patient is requesting a callback from Dr Joyce regarding being prescribed medication for focus. Patient stated was previous taking Adderall  but it has been years ago. Also requesting follow up on request. Callback number [phone]

## 2024-02-13 ENCOUNTER — Encounter (HOSPITAL_COMMUNITY): Payer: Self-pay | Admitting: *Deleted

## 2024-02-13 ENCOUNTER — Ambulatory Visit (HOSPITAL_COMMUNITY)
Admission: EM | Admit: 2024-02-13 | Discharge: 2024-02-13 | Disposition: A | Discharge status: Home / Self Care | Payer: MEDICAID | Attending: Internal Medicine | Admitting: Internal Medicine

## 2024-02-13 ENCOUNTER — Other Ambulatory Visit: Payer: Self-pay

## 2024-02-13 ENCOUNTER — Ambulatory Visit (INDEPENDENT_AMBULATORY_CARE_PROVIDER_SITE_OTHER): Payer: MEDICAID

## 2024-02-13 DIAGNOSIS — S61210A Laceration without foreign body of right index finger without damage to nail, initial encounter: Secondary | ICD-10-CM

## 2024-02-13 MED ORDER — LIDOCAINE HCL (PF) 2 % IJ SOLN
INTRAMUSCULAR | Status: AC
  Filled 2024-02-13: qty 5

## 2024-02-13 NOTE — ED Triage Notes (Addendum)
 PT presents with Lac to RT hand located knuckle of index finger. Pt reports he punched the mirrow on his car. Pt reports he will need a Tdap.

## 2024-02-13 NOTE — ED Provider Notes (Signed)
 [facility]    CSN: [id number] Arrival date & time: 02/13/24  1607      History   Chief Complaint Chief Complaint  Patient presents with   Laceration    HPI Ian [name] is a 23 y.o. male.   Ian [name] is a 23 y.o. male presenting for chief complaint of Laceration to the right index finger that happened approximately 1 hour ago.  He states he had an episode of anger and released his anger by punching his rearview mirror in his car.  This resulted in laceration that is approximately 1 to 2 cm in length to the right index finger knuckle.  Wound is not bleeding at this time.  He is unsure of the date of his last tetanus injection.  Denies numbness and tingling distally to injury, difficulty moving the right finger, and previous injury to the right hand/finger.  Denies foreign body to laceration.  He does not take blood thinners.     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 Authorizing Provider  albuterol  (VENTOLIN  HFA) 108 (90 Base) MCG/ACT inhaler Inhale 2 puffs into  the lungs every 4 (four) hours as needed for wheezing or shortness of breath. 12/12/23  Yes Banister, Sharlet POUR, MD  lamoTRIgine  (LAMICTAL ) 25 MG tablet Take 2 tablets (50 mg total) by mouth daily. 09/12/23 02/13/24 Yes Nicholaus Brad RAMAN, NP  propranolol  (INDERAL ) 10 MG tablet Take 1 tablet (10 mg total) by mouth daily. 09/12/23 02/13/24 Yes Nicholaus Brad RAMAN, NP  risperiDONE  (RISPERDAL ) 1 MG tablet Take 2 tablets (2 mg total) by mouth daily. 09/11/23 02/13/24 Yes Nicholaus Brad RAMAN, NP  sertraline  (ZOLOFT ) 100 MG tablet Take 1 tablet (100 mg total) by mouth daily. 09/12/23 02/13/24 Yes Nicholaus Brad RAMAN, NP  traZODone  (DESYREL ) 50 MG tablet Take 1 tablet (50 mg total) by mouth at bedtime as needed for up to 10 days for sleep. 09/11/23 02/13/24 Yes Nicholaus Brad RAMAN, NP    Family History Family History  Problem Relation Age of Onset   Hypertension Mother    Cancer Father        died at 40,unsure type, possibly colon   Hypertension Father    Stroke Maternal Grandmother 46   Heart disease Neg Hx     Social History Social History   Tobacco Use   Smoking status: Never   Smokeless tobacco: Never  Substance Use Topics   Alcohol use: No   Drug use: No     Allergies   Patient has no known allergies.   Review of  Systems Review of Systems Per HPI  Physical Exam Triage Vital Signs ED Triage Vitals  Encounter Vitals Group     BP 02/13/24 1621 110/81     Girls Systolic BP Percentile --      Girls Diastolic BP Percentile --      Boys Systolic BP Percentile --      Boys Diastolic BP Percentile --      Pulse Rate 02/13/24 1621 (!) 59     Resp 02/13/24 1621 20     Temp 02/13/24 1621 98.2 F (36.8 C)     Temp src --      SpO2 02/13/24 1621 96 %     Weight --      Height --      Head Circumference --      Peak Flow --      Pain Score 02/13/24 1619 4     Pain Loc --      Pain Education --      Exclude from Growth Chart --    No data found.  Updated Vital Signs BP 110/81   Pulse (!) 59   Temp 98.2 F  (36.8 C)   Resp 20   SpO2 96%   Visual Acuity Right Eye Distance:   Left Eye Distance:   Bilateral Distance:    Right Eye Near:   Left Eye Near:    Bilateral Near:     Physical Exam Vitals and nursing note reviewed.  Constitutional:      Appearance: He is not ill-appearing or toxic-appearing.  HENT:     Head: Normocephalic and atraumatic.     Right Ear: Hearing and external ear normal.     Left Ear: Hearing and external ear normal.     Nose: Nose normal.     Mouth/Throat:     Lips: Pink.  Eyes:     General: Lids are normal. Vision grossly intact. Gaze aligned appropriately.     Extraocular Movements: Extraocular movements intact.     Conjunctiva/sclera: Conjunctivae normal.  Pulmonary:     Effort: Pulmonary effort is normal.  Musculoskeletal:     Right hand: Laceration (1.5-2cm laceration over the right second MCP.) and tenderness present. No swelling, deformity or bony tenderness. Normal range of motion. Normal strength. Normal sensation. There is no disruption of two-point discrimination. Normal capillary refill. Normal pulse.     Left hand: Normal.     Cervical back: Neck supple.     Comments: 5/5 grip strength of right hand. Less than 2 cap refill. +2 right radial pulse.   Skin:    General: Skin is warm and dry.     Capillary Refill: Capillary refill takes less than 2 seconds.     Findings: Laceration (Approximately 1.5-2 cm laceration over the second MCP of the right hand.  See image below.) present. No rash.  Neurological:     General: No focal deficit present.     Mental Status: He is alert and oriented to person, place, and time. Mental status is at baseline.     Cranial Nerves: No dysarthria or facial asymmetry.  Psychiatric:        Mood and Affect: Mood normal.        Speech: Speech normal.        Behavior: Behavior normal.        Thought Content: Thought content normal.        Judgment: Judgment normal.    Right hand laceration over the right 2nd  MCP   UC Treatments / Results  Labs (all labs ordered are listed, but only abnormal results are displayed) Labs Reviewed - No data to display  EKG   Radiology DG Hand Complete Right Result Date: 02/13/2024 CLINICAL DATA:  Laceration right 2nd MCP joint. EXAM: RIGHT HAND - COMPLETE 3+ VIEW COMPARISON:  None Available. FINDINGS: There is no evidence of fracture or dislocation. There is no evidence of arthropathy or other focal bone abnormality. Soft tissues are unremarkable. No radiopaque foreign body. IMPRESSION: No fracture or foreign body. Electronically Signed   By: Franky Crease M.D.   On: 02/13/2024 17:16    Procedures Laceration Repair  Date/Time: 02/13/2024 5:49 PM  Performed by: Enedelia Dorna HERO, FNP Authorized by: Enedelia Dorna HERO, FNP   Consent:    Consent obtained:  Verbal   Consent given by:  Patient   Risks, benefits, and alternatives were discussed: yes     Risks discussed:  Need for additional repair, infection, nerve damage, pain, poor cosmetic result, poor wound healing, retained foreign body, tendon damage and vascular damage   Alternatives discussed:  No treatment Universal protocol:    Patient identity confirmed:  Verbally with patient Anesthesia:    Anesthesia method:  Local infiltration   Local anesthetic:  Lidocaine  1% w/o epi Laceration details:    Location:  Finger   Finger location:  R index finger   Length (cm):  2   Depth (mm):  5 Exploration:    Imaging obtained: x-ray     Imaging outcome: foreign body not noted     Wound exploration: wound explored through full range of motion     Contaminated: no   Treatment:    Area cleansed with:  Povidone-iodine and Shur-Clens   Amount of cleaning:  Standard Skin repair:    Repair method:  Sutures   Suture size:  4-0   Suture material:  Nylon   Suture technique:  Simple interrupted   Number of sutures:  3 Approximation:    Approximation:  Close Repair type:    Repair type:   Simple Post-procedure details:    Dressing:  Non-adherent dressing   Procedure completion:  Tolerated well, no immediate complications  (including critical care time)  Medications Ordered in UC Medications - No data to display  Initial Impression / Assessment and Plan / UC Course  I have reviewed the triage vital signs and the nursing notes.  Pertinent labs & imaging results that were available during my care of the patient were reviewed by me and considered in my medical decision making (see chart for details).   1. Laceration of right index finger without foreign body without damage to nail Laceration repaired, see procedure note above for details. Discussed wound care and cleaning at home.  Imaging: Right hand imaging is negative for foreign body/acute bony abnormality. Infection return precautions discussed.  Suture removal in 10 days.  Tdap already up-to-date, last received Tdap in February 2025.  Tylenol  as needed for pain at home.  Advised to rest and avoid activities that may increase tension to wound/sutures or expose wound to infection. Excuse note given.   Counseled patient on potential for adverse effects with medications prescribed/recommended today, strict ER and return-to-clinic precautions discussed, patient verbalized understanding.    Final Clinical Impressions(s) / UC Diagnoses   Final diagnoses:  Laceration of right index finger without foreign body without damage to nail, initial encounter     Discharge Instructions      Wound care: Please  keep the area surrounding the wound/sutures clean and dry for the next 24 hours. After 24 hours, you may get the wound wet. Gently clean wound with antibacterial soap. Do not scrub wound. Cover the area with a nonstick bandage and change the bandage 2 times a day.   You should have the sutures removed in 10 days by your primary care provider or at urgent care. Return sooner than 10 days if you experience discharge from  your laceration, redness around your laceration, warmth around your laceration, or fever.   You may take over the counter medicines as needed for aches and pains once the numbing wears off.   Thanks for letting me fix your cut today!      ED Prescriptions   None    PDMP not reviewed this encounter.   Enedelia Dorna HERO, [place] 02/13/24 1751

## 2024-02-13 NOTE — Discharge Instructions (Signed)

## 2024-02-17 ENCOUNTER — Encounter: Payer: Medicaid Other | Admitting: Family Medicine

## 2024-02-23 ENCOUNTER — Encounter: Payer: MEDICAID | Admitting: Family Medicine

## 2024-03-04 ENCOUNTER — Encounter: Payer: Self-pay | Admitting: Family Medicine

## 2024-03-04 ENCOUNTER — Ambulatory Visit: Payer: MEDICAID | Admitting: Family Medicine

## 2024-03-04 VITALS — BP 130/78 | HR 76 | Ht 68.0 in | Wt 169.2 lb

## 2024-03-04 DIAGNOSIS — F320 Major depressive disorder, single episode, mild: Secondary | ICD-10-CM

## 2024-03-04 DIAGNOSIS — Z0000 Encounter for general adult medical examination without abnormal findings: Secondary | ICD-10-CM

## 2024-03-04 DIAGNOSIS — F333 Major depressive disorder, recurrent, severe with psychotic symptoms: Secondary | ICD-10-CM

## 2024-03-04 DIAGNOSIS — F3162 Bipolar disorder, current episode mixed, moderate: Secondary | ICD-10-CM

## 2024-03-04 MED ORDER — RISPERIDONE 1 MG PO TABS: 2.0000 mg | ORAL_TABLET | Freq: Every day | ORAL | 0 refills | Status: AC

## 2024-03-04 MED ORDER — TRAZODONE HCL 50 MG PO TABS: 50.0000 mg | ORAL_TABLET | Freq: Every evening | ORAL | 0 refills | Status: AC | PRN

## 2024-03-04 MED ORDER — PROPRANOLOL HCL 10 MG PO TABS: 10.0000 mg | ORAL_TABLET | Freq: Every day | ORAL | 0 refills | Status: AC

## 2024-03-04 MED ORDER — SERTRALINE HCL 100 MG PO TABS: 100.0000 mg | ORAL_TABLET | Freq: Every day | ORAL | 0 refills | Status: AC

## 2024-03-04 MED ORDER — LAMOTRIGINE 25 MG PO TABS: 50.0000 mg | ORAL_TABLET | Freq: Every day | ORAL | 0 refills | Status: AC

## 2024-03-04 NOTE — Progress Notes (Signed)
 Name: Ian [name]   Date of Visit: 03/04/24   Date of last visit with me: Visit date not found   CHIEF COMPLAINT:  Chief Complaint  Patient presents with   Annual Exam    Fasting       HPI:  Discussed the use of AI scribe software for clinical note transcription with the patient, who gave verbal consent to proceed.  History of Present Illness   Ian [name] is a 23 year old male with depression who presents for medication management and follow-up.  He is currently taking his medications as prescribed but dislikes them. He last saw his psychologist about a week ago and has appointments every two weeks. He is running low on his medications and is here for a refill.  He reports not feeling too down and is focusing on positive aspects in his life. 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 He is also seeking a new job, preferably full-time on a third shift.  He takes trazodone every night and sometimes as needed. He experiences grogginess upon waking, which is less severe on nights he does not take trazodone. He goes to bed around 10 PM and wakes up around 8 or 9 AM. He sometimes feels tired as the day progresses.  He is also taking Zoloft. He has experienced hand tremors but is unsure if it is related to his medication.  He has just started school and has assignments due soon. He is trying to balance his studies with finding employment.         OBJECTIVE:       03/04/2024    9:04 AM  Depression screen PHQ 2/9  Decreased Interest 1  Down, Depressed, Hopeless 1  PHQ - 2 Score 2  Altered sleeping 1  Tired, decreased energy 1  Change in appetite 1  Feeling bad or failure about yourself  1  Trouble concentrating 1  Moving slowly or fidgety/restless 1  Suicidal thoughts 0  PHQ-9 Score 8     BP Readings from Last 3 Encounters:  03/04/24 130/78  02/13/24 110/81  12/12/23 139/84    BP 130/78    Pulse 76   Ht 5' 8 (1.727 m)   Wt 169 lb 3.2 oz (76.7 kg)   SpO2 98%   BMI 25.73 kg/m    Physical Exam          Physical Exam Constitutional:      Appearance: Normal appearance.  Neurological:     Mental Status: He is alert.     ASSESSMENT/PLAN:   Assessment & Plan Depression, major, single episode, mild (HCC)  Bipolar 1 disorder, mixed, moderate (HCC)  Severe recurrent major depressive disorder with psychotic features (HCC)  Annual physical exam    Assessment and Plan    Depression and Bipolar 1 Depression well-managed with Zoloft and trazodone. Discussed serotonin syndrome risk due to medication combination. - Refill Zoloft prescription. - Discuss serotonin syndrome symptoms and include in AVS. - Advise psychiatric consultation, discussed with patient that it would be beneficial for him to see a psychiatrist at least quarterly given his extensive psychiatric history.  Patient will attempt to see a psychiatrist at his current facility and if requires a referral will let us  know.  Patient understanding and agreeable with plan.  Insomnia Insomnia managed with trazodone. Reports grogginess, desires to discontinue trazodone. - Refill trazodone prescription and advise use only as needed. -  Plan to slowly wean off trazodone. - Evaluate sleep patterns and grogginess at next visit in four months.  General Health Maintenance Routine health maintenance includes baseline blood work and screening tests. Advised HIV and hepatitis C screening. - Order CBC and BMP. - Order HIV and hepatitis C screening tests.         Matther Labell A. Vita MD [facility]

## 2024-03-04 NOTE — Patient Instructions (Addendum)
###   Serotonin Syndrome Symptoms     Serotonin syndrome is a rare but serious condition that can happen when there is too much serotonin, a chemical in the brain, usually because of certain medicines. It can happen quickly, often within hours of starting, increasing, or combining medicines that affect serotonin, such as some antidepressants, migraine medicines, or other drugs.[1][2][3][4][5][6][7][8][9]      **What to watch for:    Serotonin syndrome usually causes a mix of symptoms. The most important ones to look out for are:      - **Changes in how you feel or act:** You may feel confused, restless, anxious, or agitated. Some people may feel drowsy, have trouble thinking clearly, or even see things that aren't there (hallucinations). In severe cases, people can have seizures or pass out.[2][3][4][5][7][8][9]      - **Problems with your muscles:** You might notice shaking (tremor), stiff or rigid muscles, twitching or jerking movements (myoclonus), or trouble with coordination. Your reflexes may be stronger than usual, and you may feel shivery or have muscle spasms.[1][2][3][4][5][7][8][9]      - **Body changes:** You may sweat a lot, feel hot (fever), have a fast heartbeat, or notice your blood pressure going up or down. Other symptoms can include feeling dizzy, flushed skin, nausea, vomiting, or diarrhea.[2][3][4][5][7][8][9]      **When to get help:**      Serotonin syndrome can be life-threatening if not treated quickly. If you notice any of the symptoms above, especially if you have recently started, changed, or combined medicines that affect serotonin, get medical help right away.[1][2][3][4][5][6][7][8][9]      **Medicines that can cause serotonin syndrome:**      Some common medicines that can increase serotonin include:      - Antidepressants (like SSRIs, SNRIs, tricyclics, MAOIs)      - Migraine medicines (like triptans)      - Some pain medicines (like tramadol)      - Some  cough medicines      - Certain antibiotics and anti-nausea medicines      - Herbal supplements (like St. John's wort)[1][2][3][4][5][7][8][9]      **What to do:**      If you think you might have serotonin syndrome, stop taking the medicine and seek medical care immediately. Treatment usually means stopping the medicine and getting supportive care in a hospital if needed. Most people recover fully if treated quickly.[3][4][5][8][9]      **Prevention:**      Always tell your healthcare provider about all the medicines and supplements you take. Never start, stop, or change the dose of any medicine without talking to your provider first.[1][2][3][4][5][7][8][9]

## 2024-03-05 LAB — CBC WITH DIFFERENTIAL/PLATELET
Basophils Absolute: 0.1 x10E3/uL (ref 0.0–0.2)
Basos: 2 %
EOS (ABSOLUTE): 0.8 x10E3/uL — ABNORMAL HIGH (ref 0.0–0.4)
Eos: 15 %
Hematocrit: 49.5 % (ref 37.5–51.0)
Hemoglobin: 15.8 g/dL (ref 13.0–17.7)
Immature Grans (Abs): 0 x10E3/uL (ref 0.0–0.1)
Immature Granulocytes: 0 %
Lymphocytes Absolute: 1.7 x10E3/uL (ref 0.7–3.1)
Lymphs: 35 %
MCH: 28.4 pg (ref 26.6–33.0)
MCHC: 31.9 g/dL (ref 31.5–35.7)
MCV: 89 fL (ref 79–97)
Monocytes Absolute: 0.4 x10E3/uL (ref 0.1–0.9)
Monocytes: 7 %
Neutrophils Absolute: 2.1 x10E3/uL (ref 1.4–7.0)
Neutrophils: 41 %
Platelets: 278 x10E3/uL (ref 150–450)
RBC: 5.56 x10E6/uL (ref 4.14–5.80)
RDW: 13.9 % (ref 11.6–15.4)
WBC: 5 x10E3/uL (ref 3.4–10.8)

## 2024-03-05 LAB — COMPREHENSIVE METABOLIC PANEL WITH GFR
ALT: 39 IU/L (ref 0–44)
AST: 24 IU/L (ref 0–40)
Albumin: 4.6 g/dL (ref 4.3–5.2)
Alkaline Phosphatase: 72 IU/L (ref 44–121)
BUN/Creatinine Ratio: 11 (ref 9–20)
BUN: 11 mg/dL (ref 6–20)
Bilirubin Total: 0.5 mg/dL (ref 0.0–1.2)
CO2: 21 mmol/L (ref 20–29)
Calcium: 10.1 mg/dL (ref 8.7–10.2)
Chloride: 103 mmol/L (ref 96–106)
Creatinine, Ser: 0.98 mg/dL (ref 0.76–1.27)
Globulin, Total: 2.7 g/dL (ref 1.5–4.5)
Glucose: 70 mg/dL (ref 70–99)
Potassium: 4.7 mmol/L (ref 3.5–5.2)
Sodium: 140 mmol/L (ref 134–144)
Total Protein: 7.3 g/dL (ref 6.0–8.5)
eGFR: 111 mL/min/1.73 (ref >59)

## 2024-03-05 LAB — HIV ANTIBODY (ROUTINE TESTING W REFLEX): HIV Screen 4th Generation wRfx: NONREACTIVE

## 2024-03-05 LAB — HEPATITIS C ANTIBODY: Hep C Virus Ab: NONREACTIVE

## 2024-03-07 ENCOUNTER — Ambulatory Visit: Payer: Self-pay | Admitting: Family Medicine

## 2024-06-22 ENCOUNTER — Ambulatory Visit (HOSPITAL_COMMUNITY)
Admission: EM | Admit: 2024-06-22 | Discharge: 2024-06-22 | Disposition: A | Discharge status: Home / Self Care | Payer: Self-pay

## 2024-06-22 ENCOUNTER — Encounter (HOSPITAL_COMMUNITY): Payer: Self-pay | Admitting: Emergency Medicine

## 2024-06-22 DIAGNOSIS — D170 Benign lipomatous neoplasm of skin and subcutaneous tissue of head, face and neck: Secondary | ICD-10-CM

## 2024-06-22 NOTE — ED Triage Notes (Signed)
 Pt reports had mass on upper spine for several years. Denies any drainage.

## 2024-06-22 NOTE — ED Provider Notes (Signed)
 [facility] [place]  Note:  This document was prepared using Conservation officer, historic buildings and may include unintentional dictation errors.  MRN: [id number] DOB: 2000-08-24  Subjective:   Ian [name] is a 23 y.o. male presenting for swollen mass at the top of his thoracic spine and the base of his posterior neck.  Patient reports he has had this bump on his posterior neck for several years and has never really caused any problems however he has noted with some activities difficulty moving his neck and head appropriately to perform activities.  Patient would like to have bump removed to improve quality of life.  Patient states symptoms while driving is hard for him to look side-to-side appropriately and when playing basketball and other sports it limits his ability to perform.  No current facility-administered medications for this encounter.  Current Outpatient Medications:    albuterol  (VENTOLIN  HFA) 108 (90 Base) MCG/ACT inhaler, Inhale 2 puffs into the lungs every 4 (four) hours as needed for wheezing or shortness of breath., Disp: 1 each, Rfl: 0   lamoTRIgine  (LAMICTAL ) 25 MG tablet, Take 2 tablets (50 mg total) by mouth daily., Disp: 60 tablet, Rfl: 0   propranolol  (INDERAL ) 10 MG tablet, Take 1 tablet (10 mg total) by mouth daily., Disp: 30 tablet, Rfl: 0   risperiDONE  (RISPERDAL ) 1 MG tablet, Take 2 tablets (2 mg total) by mouth daily., Disp: 60 tablet, Rfl: 0   sertraline  (ZOLOFT ) 100 MG tablet, Take 1 tablet (100 mg total) by mouth daily., Disp: 30 tablet, Rfl: 0   traZODone  (DESYREL ) 50 MG tablet, Take 1 tablet (50 mg total) by mouth at bedtime as needed for sleep., Disp: 30 tablet, Rfl: 0   No Known Allergies  Past Medical History:  Diagnosis Date   Acne    ADHD (attention deficit hyperactivity disorder)    Allergy    Asthma    Depression    Functional murmur    Migraines      Past Surgical History:  Procedure Laterality Date   NO PAST SURGERIES  2017     Family History  Problem Relation Age of Onset   Hypertension Mother    Cancer Father        died at 40,unsure type, possibly colon   Hypertension Father    Stroke Maternal Grandmother 13   Heart disease Neg Hx     Social History   Tobacco Use   Smoking status: Never   Smokeless tobacco: Never  Substance Use Topics   Alcohol use: No   Drug use: No    ROS Refer to HPI for ROS details.  Objective:    Vitals: BP 126/69 (BP Location: Left Arm)   Pulse 71   Temp 98.2 F (36.8 C) (Oral)   Resp 15   SpO2 98%   Physical Exam Vitals and nursing note reviewed.  Constitutional:      General: He is not in acute distress.    Appearance: He is well-developed. He is not ill-appearing or toxic-appearing.  HENT:     Head: Normocephalic.     Nose: Nose normal.     Mouth/Throat:     Mouth: Mucous membranes are moist.     Pharynx: Oropharynx is clear.  Neck:   Cardiovascular:     Rate and Rhythm: Normal rate.  Pulmonary:     Effort: Pulmonary effort is normal. No respiratory distress.  Musculoskeletal:     Cervical back: Rigidity, torticollis and tenderness present. No edema or erythema. Pain  with movement present. No spinous process tenderness or muscular tenderness. Decreased range of motion.  Lymphadenopathy:     Cervical: No cervical adenopathy.  Skin:    General: Skin is warm and dry.  Neurological:     General: No focal deficit present.     Mental Status: He is alert and oriented to person, place, and time.  Psychiatric:        Mood and Affect: Mood normal.        Behavior: Behavior normal.     Procedures  No results found for this or any previous visit (from the past 24 hours).  Assessment and Plan :     Discharge Instructions       1. Lipoma of neck (Primary) - Ambulatory referral to General Surgery for follow-up evaluation and lipoma removal surgery - Ambulatory referral to Dermatology for follow-up evaluation and lipoma removal procedure. -  Follow-up with either dermatology or general surgery for further evaluation and treatment of chronic lipoma. - Take ibuprofen  or Tylenol  for any pain or inflammation secondary to lipoma.   -Continue to monitor symptoms for any change in severity if there is any escalation of current symptoms or development of new symptoms follow-up in ER for further evaluation and management.      Ian [name]   Ian [name], Ian [name], Ian [name] 06/22/24 1616

## 2024-06-22 NOTE — Discharge Instructions (Addendum)
  1. Lipoma of neck (Primary) - Ambulatory referral to General Surgery for follow-up evaluation and lipoma removal surgery - Ambulatory referral to Dermatology for follow-up evaluation and lipoma removal procedure. 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 - Take ibuprofen  or Tylenol  for any pain or inflammation secondary to lipoma.   -Continue to monitor symptoms for any change in severity if there is any escalation of current symptoms or development of new symptoms follow-up in ER for further evaluation and management.

## 2024-07-04 ENCOUNTER — Encounter: Payer: MEDICAID | Admitting: Family Medicine

## 2024-07-08 ENCOUNTER — Encounter: Payer: Self-pay | Admitting: Family Medicine

## 2024-08-09 ENCOUNTER — Other Ambulatory Visit: Payer: MEDICAID

## 2024-08-09 DIAGNOSIS — Z79899 Other long term (current) drug therapy: Secondary | ICD-10-CM

## 2024-08-10 ENCOUNTER — Ambulatory Visit: Payer: Self-pay | Admitting: Family Medicine

## 2024-08-10 LAB — CBC WITH DIFFERENTIAL/PLATELET
Basophils Absolute: 0.1 x10E3/uL (ref 0.0–0.2)
Basos: 2 %
EOS (ABSOLUTE): 0.6 x10E3/uL — ABNORMAL HIGH (ref 0.0–0.4)
Eos: 15 %
Hematocrit: 48 % (ref 37.5–51.0)
Hemoglobin: 15.3 g/dL (ref 13.0–17.7)
Immature Grans (Abs): 0 x10E3/uL (ref 0.0–0.1)
Immature Granulocytes: 0 %
Lymphocytes Absolute: 1.6 x10E3/uL (ref 0.7–3.1)
Lymphs: 37 %
MCH: 28.4 pg (ref 26.6–33.0)
MCHC: 31.9 g/dL (ref 31.5–35.7)
MCV: 89 fL (ref 79–97)
Monocytes Absolute: 0.4 x10E3/uL (ref 0.1–0.9)
Monocytes: 9 %
Neutrophils Absolute: 1.5 x10E3/uL (ref 1.4–7.0)
Neutrophils: 37 %
Platelets: 251 x10E3/uL (ref 150–450)
RBC: 5.39 x10E6/uL (ref 4.14–5.80)
RDW: 13.1 % (ref 11.6–15.4)
WBC: 4.2 x10E3/uL (ref 3.4–10.8)

## 2024-08-10 LAB — CMP14+EGFR
ALT: 17 IU/L (ref 0–44)
AST: 26 IU/L (ref 0–40)
Albumin: 4.5 g/dL (ref 4.3–5.2)
Alkaline Phosphatase: 68 IU/L (ref 47–123)
BUN/Creatinine Ratio: 11 (ref 9–20)
BUN: 11 mg/dL (ref 6–20)
Bilirubin Total: 0.3 mg/dL (ref 0.0–1.2)
CO2: 23 mmol/L (ref 20–29)
Calcium: 9.8 mg/dL (ref 8.7–10.2)
Chloride: 104 mmol/L (ref 96–106)
Creatinine, Ser: 1.03 mg/dL (ref 0.76–1.27)
Globulin, Total: 2.2 g/dL (ref 1.5–4.5)
Glucose: 84 mg/dL (ref 70–99)
Potassium: 4.5 mmol/L (ref 3.5–5.2)
Sodium: 141 mmol/L (ref 134–144)
Total Protein: 6.7 g/dL (ref 6.0–8.5)
eGFR: 104 mL/min/1.73

## 2024-08-10 LAB — VITAMIN D 25 HYDROXY (VIT D DEFICIENCY, FRACTURES): Vit D, 25-Hydroxy: 30.3 ng/mL (ref 30.0–100.0)

## 2024-08-10 LAB — B12 AND FOLATE PANEL
Folate: 10.8 ng/mL
Vitamin B-12: 1124 pg/mL (ref 232–1245)

## 2024-08-10 LAB — TSH RFX ON ABNORMAL TO FREE T4: TSH: 1.22 u[IU]/mL (ref 0.450–4.500)

## 2024-08-10 LAB — HEMOGLOBIN A1C
Est. average glucose Bld gHb Est-mCnc: 103 mg/dL
Hgb A1c MFr Bld: 5.2 % (ref 4.8–5.6)

## 2025-03-07 ENCOUNTER — Encounter: Payer: MEDICAID | Admitting: Family Medicine
